# Patient Record
Sex: Female | Born: 1970 | Race: White | Hispanic: No | Marital: Married | Smoking: Current every day smoker
Health system: Southern US, Community
[De-identification: ages and names within clinical notes are randomized; demographics above are authoritative.]

## PROBLEM LIST (undated history)

## (undated) DIAGNOSIS — Z720 Tobacco use: Secondary | ICD-10-CM

## (undated) DIAGNOSIS — I2510 Atherosclerotic heart disease of native coronary artery without angina pectoris: Secondary | ICD-10-CM

## (undated) DIAGNOSIS — Z9289 Personal history of other medical treatment: Secondary | ICD-10-CM

## (undated) DIAGNOSIS — E785 Hyperlipidemia, unspecified: Secondary | ICD-10-CM

## (undated) HISTORY — DX: Hyperlipidemia, unspecified: E78.5

## (undated) HISTORY — DX: Personal history of other medical treatment: Z92.89

## (undated) HISTORY — DX: Atherosclerotic heart disease of native coronary artery without angina pectoris: I25.10

---

## 2000-04-05 ENCOUNTER — Other Ambulatory Visit: Admission: RE | Admit: 2000-04-05 | Discharge: 2000-04-05 | Payer: Self-pay | Admitting: Obstetrics and Gynecology

## 2000-10-28 ENCOUNTER — Inpatient Hospital Stay (HOSPITAL_COMMUNITY): Admission: AD | Admit: 2000-10-28 | Discharge: 2000-10-30 | Payer: Self-pay | Admitting: Obstetrics and Gynecology

## 2000-12-04 ENCOUNTER — Other Ambulatory Visit: Admission: RE | Admit: 2000-12-04 | Discharge: 2000-12-04 | Payer: Self-pay | Admitting: Obstetrics and Gynecology

## 2006-01-24 ENCOUNTER — Observation Stay: Payer: Self-pay | Admitting: Obstetrics and Gynecology

## 2007-09-06 IMAGING — US US OB < 14 WEEKS - US OB TV
1 series · 17 of 28 positions shown · non-contrast
Comparison: none

REASON FOR EXAM: vaginal bleeding
COMMENTS:

[Series 1: us ob < 14 weeks - us ob tv · 17 of 67 slices shown]
[im 1/67]
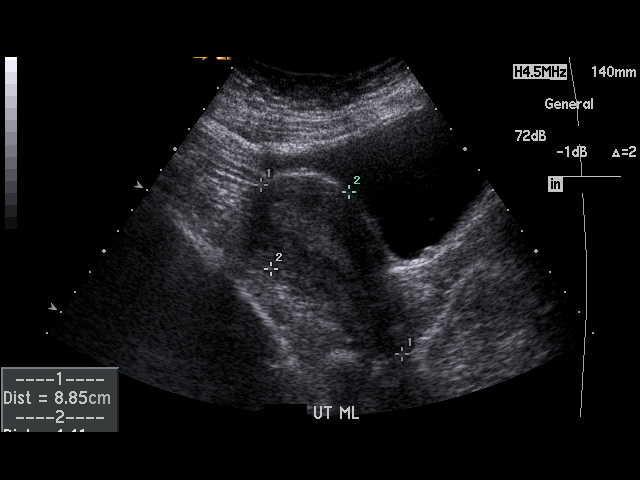
[im 5/67]
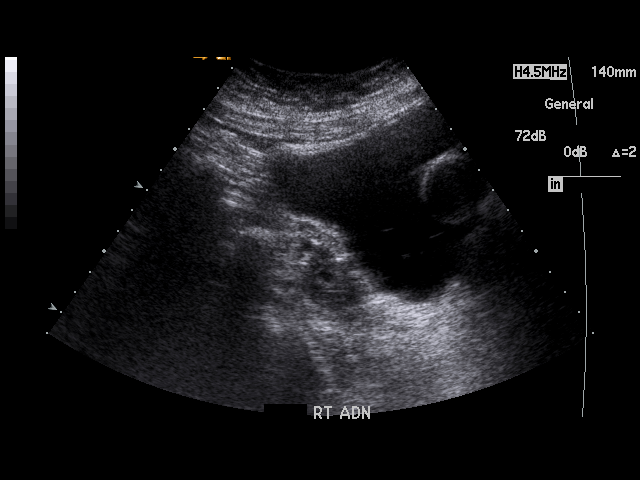
[im 10/67]
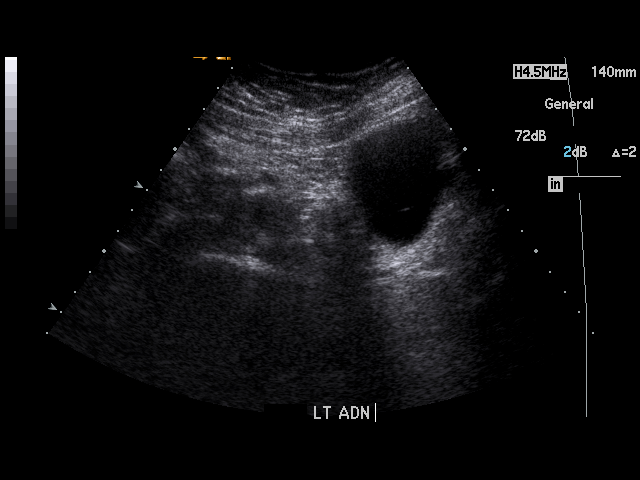
[im 13/67]
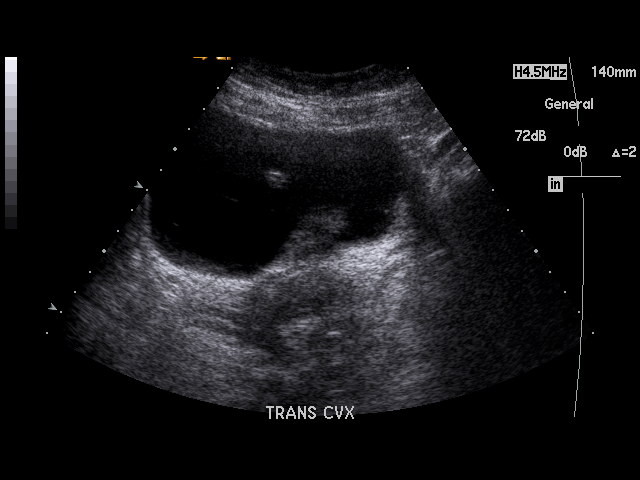
[im 18/67]
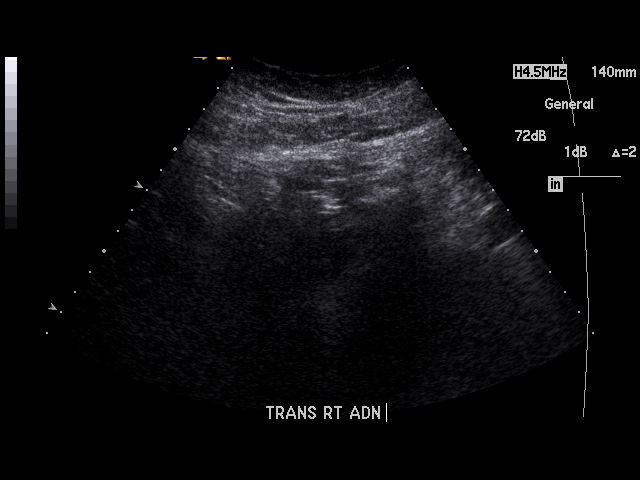
[im 23/67]
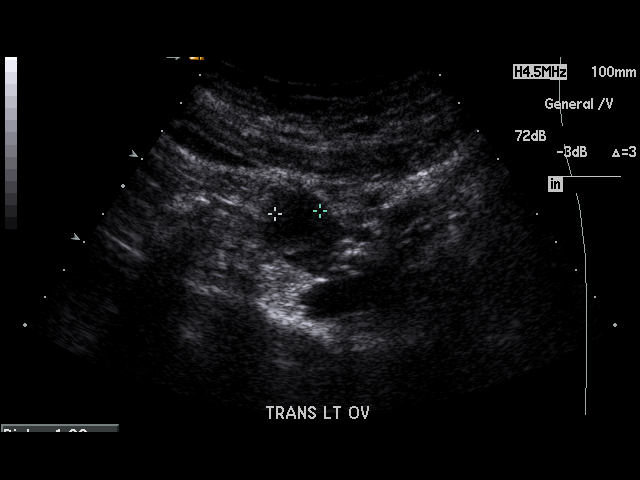
[im 25/67]
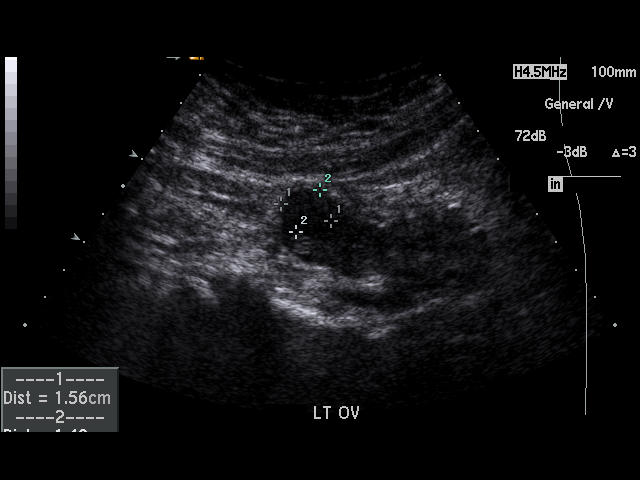
[im 30/67]
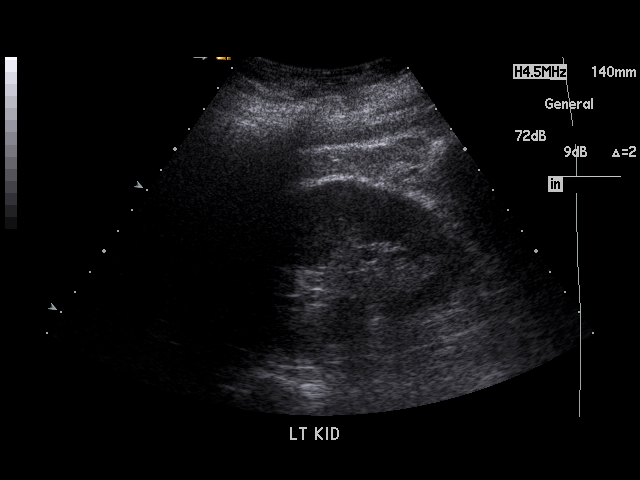
[im 35/67]
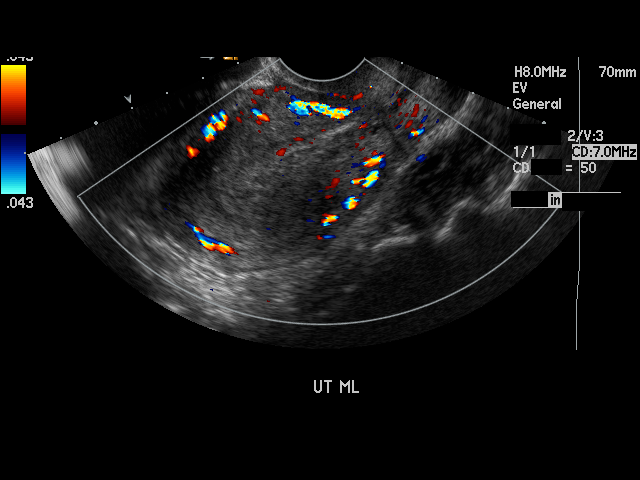
[im 37/67]
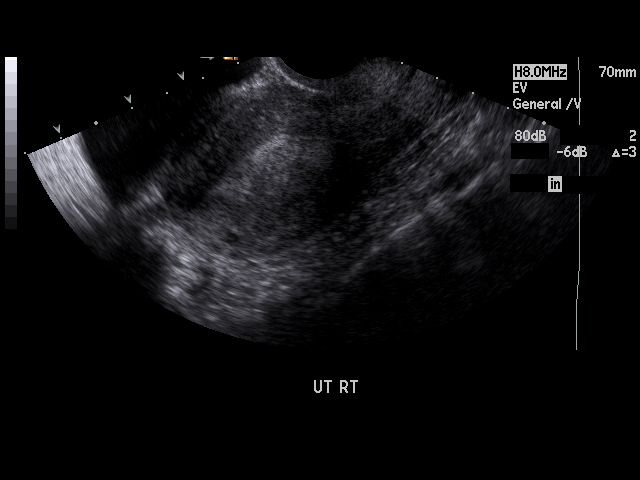
[im 42/67]
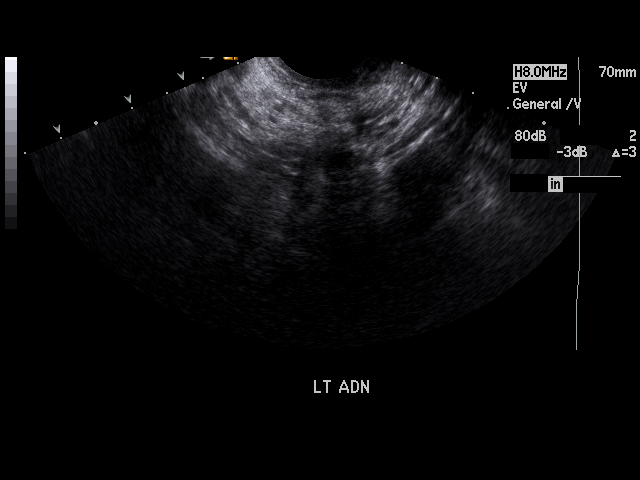
[im 45/67]
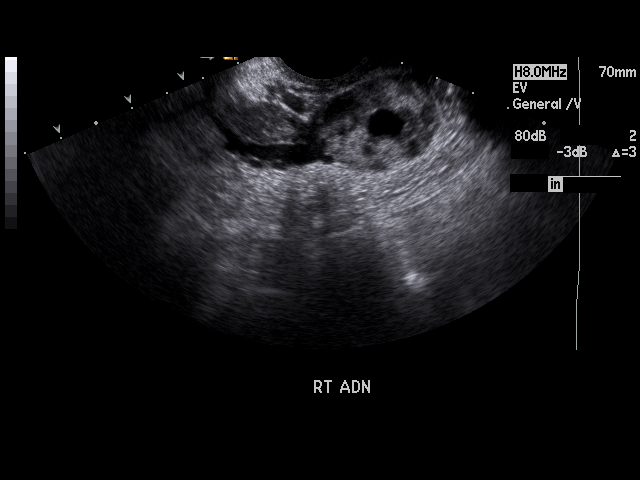
[im 49/67]
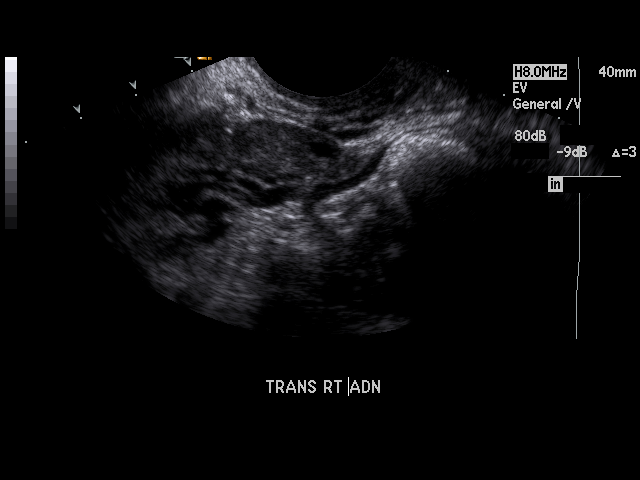
[im 54/67]
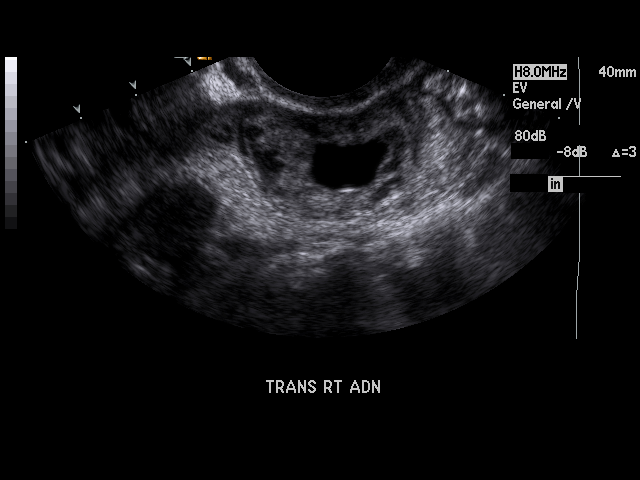
[im 57/67]
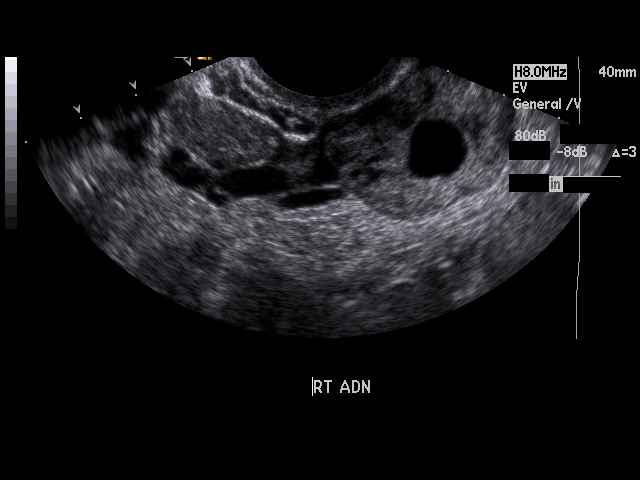
[im 62/67]
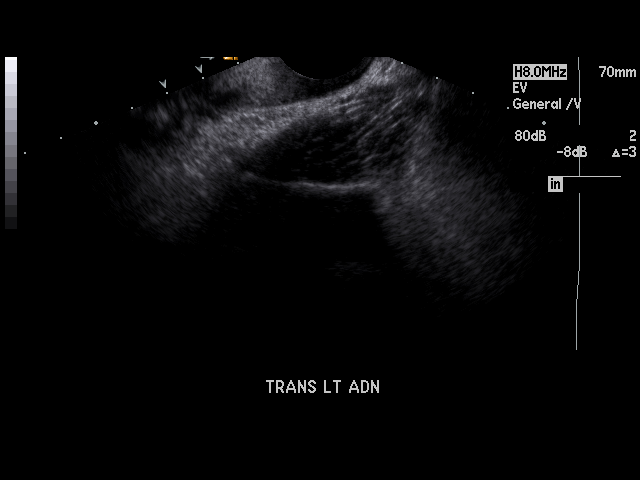
[im 67/67]
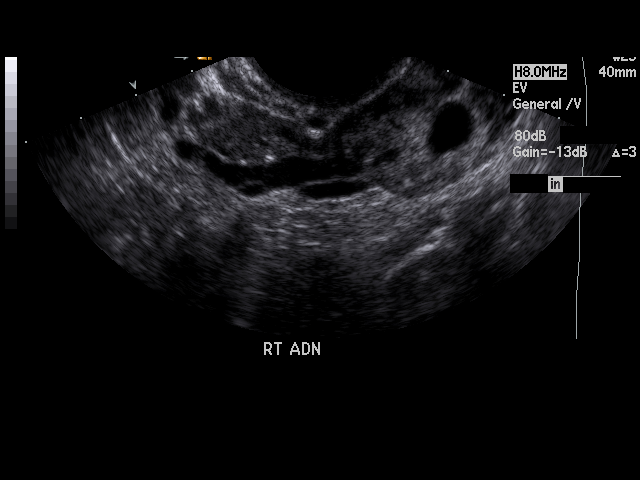

[17 of 28 positions shown; findings below may reference images not displayed]

PROCEDURE:     US  - US OB LESS THAN 14 WEEKS  - January 24, 2006  [DATE]

RESULT:          The uterus measures 8.85 x 4.41 cm.  Endometrial thickness
is 1.26 cm.  There does not appear to be ultrasound evidence of an
intrauterine gestational sac.  Evaluation of the RIGHT adnexal region
demonstrates a thick-walled cystic mass within the RIGHT adnexa measuring
3.49 x 2.97 x 2.13 cm.  This appears to be adjacent to the RIGHT ovary and
demonstrates peripheral flow.  The RIGHT ovary measures 2.26 x 1.61 x
cm and the LEFT 3.19 x 2.01 x 1.83 cm.  The LEFT ovary demonstrates a
dominant cyst versus a follicle measuring 1.56 x 1.33 x 1.43 cm.  There
appears to be prominent vessels within the RIGHT adnexal region.  No
evidence of drainable loculated fluid collections nor significant free fluid
is demonstrated within the pelvis.  The urinary bladder is distended with
urine.
IMPRESSION: Findings worrisome for a RIGHT adnexal ectopic
pregnancy.  Correlation with the patient's beta hCG is recommended.  If the
beta hCG does not correlate with an ectopic pregnancy, then other etiologies
such as a RIGHT adnexal mass is of differential consideration.  OB/GYN
consultation is recommended.

Dr. Lan, of the emergency department, was informed of these findings at
the time of the initial interpretation.

## 2014-03-25 ENCOUNTER — Ambulatory Visit: Payer: Self-pay | Admitting: Physician Assistant

## 2015-03-03 ENCOUNTER — Ambulatory Visit (INDEPENDENT_AMBULATORY_CARE_PROVIDER_SITE_OTHER): Payer: BLUE CROSS/BLUE SHIELD

## 2015-03-03 ENCOUNTER — Encounter: Payer: Self-pay | Admitting: Podiatry

## 2015-03-03 ENCOUNTER — Ambulatory Visit (INDEPENDENT_AMBULATORY_CARE_PROVIDER_SITE_OTHER): Payer: BLUE CROSS/BLUE SHIELD | Admitting: Podiatry

## 2015-03-03 VITALS — BP 162/99 | HR 78

## 2015-03-03 DIAGNOSIS — M722 Plantar fascial fibromatosis: Secondary | ICD-10-CM

## 2015-03-03 MED ORDER — MELOXICAM 7.5 MG PO TABS: 7.5000 mg | ORAL_TABLET | Freq: Every day | ORAL | Status: DC

## 2015-03-03 NOTE — Progress Notes (Signed)
   Subjective:    Patient ID: Marisa Malone, female    DOB: July 30, 1970, 44 y.o.   MRN: [id number]  HPI   44 year old female presents the office as concerns the left heel pain which is been ongoing for approximately 2 years. She states that she has pain in the morning  Which is better with ambulation. She does have pain and she stands on her feet all day. She has purchased some pads which did not help and she is also been taping her foot which helped some. She states the pain is intermittent and 9 daily basis. She denies any history of injury or trauma. No swelling or redness. No tingling or numbness. The pain does not wake are benign. No other complaints at this time.    Review of Systems  All other systems reviewed and are negative.      Objective:   Physical Exam AAO x3, NAD DP/PT pulses palpable bilaterally, CRT less than 3 seconds Protective sensation intact with Simms Weinstein monofilament, vibratory sensation intact, Achilles tendon reflex intact Tenderness to palpation overlying the plantar medial tubercle of the calcaneus to the left heel at the insertion of the plantar fascia. There is no pain along the course of plantar fascia within the arch of the foot. There is no pain with lateral compression of the calcaneus or pain the vibratory sensation. No pain on the posterior aspect of the calcaneus or along the course/insertion of the Achilles tendon. There is no overlying edema, erythema, increase in warmth. No other areas of tenderness palpation or pain with vibratory sensation to the foot/ankle. MMT 5/5, ROM WNL No open lesions or pre-ulcerative lesions are identified. No pain with calf compression, swelling, warmth, erythema.     Assessment & Plan:   44 year old female with left heel pain, likely plantar fasciitis -Treatment options discussed including all alternatives, risks, and complications -X-rays were obtained and reviewed with the patient.  -Etiology of symptoms were  discussed -Patient elects to proceed with steroid injection into the left heel. Under sterile skin preparation, a total of 2.5cc of kenalog 10, 0.5% Marcaine plain, and 2% lidocaine plain were infiltrated into the symptomatic area without complication. A band-aid was applied. Patient tolerated the injection well without complication. Post-injection care with discussed with the patient. Discussed with the patient to ice the area over the next couple of days to help prevent a steroid flare.  -Prescribed mobic. Discussed side effects of the medication and directed to stop if any are to occur and call the office. -Ice and stretching exercises daily -Plantar fasical brace dispensed  -Discussed shoegear modifications and orthotics.  -Follow-up in 3 weeks or sooner if any problems arise. In the meantime, encouraged to call the office with any questions, concerns, change in symptoms.   Ovid Curd, DPM

## 2015-03-03 NOTE — Patient Instructions (Signed)
Plantar Fasciitis (Heel Spur Syndrome) with Rehab The plantar fascia is a fibrous, ligament-like, soft-tissue structure that spans the bottom of the foot. Plantar fasciitis is a condition that causes pain in the foot due to inflammation of the tissue. SYMPTOMS   Pain and tenderness on the underneath side of the foot.  Pain that worsens with standing or walking. CAUSES  Plantar fasciitis is caused by irritation and injury to the plantar fascia on the underneath side of the foot. Common mechanisms of injury include:  Direct trauma to bottom of the foot.  Damage to a small nerve that runs under the foot where the main fascia attaches to the heel bone.  Stress placed on the plantar fascia due to bone spurs. RISK INCREASES WITH:   Activities that place stress on the plantar fascia (running, jumping, pivoting, or cutting).  Poor strength and flexibility.  Improperly fitted shoes.  Tight calf muscles.  Flat feet.  Failure to warm-up properly before activity.  Obesity. PREVENTION  Warm up and stretch properly before activity.  Allow for adequate recovery between workouts.  Maintain physical fitness:  Strength, flexibility, and endurance.  Cardiovascular fitness.  Maintain a health body weight.  Avoid stress on the plantar fascia.  Wear properly fitted shoes, including arch supports for individuals who have flat feet. PROGNOSIS  If treated properly, then the symptoms of plantar fasciitis usually resolve without surgery. However, occasionally surgery is necessary. RELATED COMPLICATIONS   Recurrent symptoms that may result in a chronic condition.  Problems of the lower back that are caused by compensating for the injury, such as limping.  Pain or weakness of the foot during push-off following surgery.  Chronic inflammation, scarring, and partial or complete fascia tear, occurring more often from repeated injections. TREATMENT  Treatment initially involves the use of  ice and medication to help reduce pain and inflammation. The use of strengthening and stretching exercises may help reduce pain with activity, especially stretches of the Achilles tendon. These exercises may be performed at home or with a therapist. Your caregiver may recommend that you use heel cups of arch supports to help reduce stress on the plantar fascia. Occasionally, corticosteroid injections are given to reduce inflammation. If symptoms persist for greater than 6 months despite non-surgical (conservative), then surgery may be recommended.  MEDICATION   If pain medication is necessary, then nonsteroidal anti-inflammatory medications, such as aspirin and ibuprofen, or other minor pain relievers, such as acetaminophen, are often recommended.  Do not take pain medication within 7 days before surgery.  Prescription pain relievers may be given if deemed necessary by your caregiver. Use only as directed and only as much as you need.  Corticosteroid injections may be given by your caregiver. These injections should be reserved for the most serious cases, because they may only be given a certain number of times. HEAT AND COLD  Cold treatment (icing) relieves pain and reduces inflammation. Cold treatment should be applied for 10 to 15 minutes every 2 to 3 hours for inflammation and pain and immediately after any activity that aggravates your symptoms. Use ice packs or massage the area with a piece of ice (ice massage).  Heat treatment may be used prior to performing the stretching and strengthening activities prescribed by your caregiver, physical therapist, or athletic trainer. Use a heat pack or soak the injury in warm water. SEEK IMMEDIATE MEDICAL CARE IF:  Treatment seems to offer no benefit, or the condition worsens.  Any medications produce adverse side effects. EXERCISES RANGE   OF MOTION (ROM) AND STRETCHING EXERCISES - Plantar Fasciitis (Heel Spur Syndrome) These exercises may help you  when beginning to rehabilitate your injury. Your symptoms may resolve with or without further involvement from your physician, physical therapist or athletic trainer. While completing these exercises, remember:   Restoring tissue flexibility helps normal motion to return to the joints. This allows healthier, less painful movement and activity.  An effective stretch should be held for at least 30 seconds.  A stretch should never be painful. You should only feel a gentle lengthening or release in the stretched tissue. RANGE OF MOTION - Toe Extension, Flexion  Sit with your right / left leg crossed over your opposite knee.  Grasp your toes and gently pull them back toward the top of your foot. You should feel a stretch on the bottom of your toes and/or foot.  Hold this stretch for __________ seconds.  Now, gently pull your toes toward the bottom of your foot. You should feel a stretch on the top of your toes and or foot.  Hold this stretch for __________ seconds. Repeat __________ times. Complete this stretch __________ times per day.  RANGE OF MOTION - Ankle Dorsiflexion, Active Assisted  Remove shoes and sit on a chair that is preferably not on a carpeted surface.  Place right / left foot under knee. Extend your opposite leg for support.  Keeping your heel down, slide your right / left foot back toward the chair until you feel a stretch at your ankle or calf. If you do not feel a stretch, slide your bottom forward to the edge of the chair, while still keeping your heel down.  Hold this stretch for __________ seconds. Repeat __________ times. Complete this stretch __________ times per day.  STRETCH - Gastroc, Standing  Place hands on wall.  Extend right / left leg, keeping the front knee somewhat bent.  Slightly point your toes inward on your back foot.  Keeping your right / left heel on the floor and your knee straight, shift your weight toward the wall, not allowing your back to  arch.  You should feel a gentle stretch in the right / left calf. Hold this position for __________ seconds. Repeat __________ times. Complete this stretch __________ times per day. STRETCH - Soleus, Standing  Place hands on wall.  Extend right / left leg, keeping the other knee somewhat bent.  Slightly point your toes inward on your back foot.  Keep your right / left heel on the floor, bend your back knee, and slightly shift your weight over the back leg so that you feel a gentle stretch deep in your back calf.  Hold this position for __________ seconds. Repeat __________ times. Complete this stretch __________ times per day. STRETCH - Gastrocsoleus, Standing  Note: This exercise can place a lot of stress on your foot and ankle. Please complete this exercise only if specifically instructed by your caregiver.   Place the ball of your right / left foot on a step, keeping your other foot firmly on the same step.  Hold on to the wall or a rail for balance.  Slowly lift your other foot, allowing your body weight to press your heel down over the edge of the step.  You should feel a stretch in your right / left calf.  Hold this position for __________ seconds.  Repeat this exercise with a slight bend in your right / left knee. Repeat __________ times. Complete this stretch __________ times per day.    STRENGTHENING EXERCISES - Plantar Fasciitis (Heel Spur Syndrome)  These exercises may help you when beginning to rehabilitate your injury. They may resolve your symptoms with or without further involvement from your physician, physical therapist or athletic trainer. While completing these exercises, remember:   Muscles can gain both the endurance and the strength needed for everyday activities through controlled exercises.  Complete these exercises as instructed by your physician, physical therapist or athletic trainer. Progress the resistance and repetitions only as guided. STRENGTH -  Towel Curls  Sit in a chair positioned on a non-carpeted surface.  Place your foot on a towel, keeping your heel on the floor.  Pull the towel toward your heel by only curling your toes. Keep your heel on the floor.  If instructed by your physician, physical therapist or athletic trainer, add ____________________ at the end of the towel. Repeat __________ times. Complete this exercise __________ times per day. STRENGTH - Ankle Inversion  Secure one end of a rubber exercise band/tubing to a fixed object (table, pole). Loop the other end around your foot just before your toes.  Place your fists between your knees. This will focus your strengthening at your ankle.  Slowly, pull your big toe up and in, making sure the band/tubing is positioned to resist the entire motion.  Hold this position for __________ seconds.  Have your muscles resist the band/tubing as it slowly pulls your foot back to the starting position. Repeat __________ times. Complete this exercises __________ times per day.  Document Released: 05/16/2005 Document Revised: 08/08/2011 Document Reviewed: 08/28/2008 [employer] Patient Information 2015 [employer]. This information is not intended to replace advice given to you by your health care provider. Make sure you discuss any questions you have with your health care provider.  

## 2015-03-24 ENCOUNTER — Encounter: Payer: Self-pay | Admitting: Podiatry

## 2015-03-24 ENCOUNTER — Ambulatory Visit (INDEPENDENT_AMBULATORY_CARE_PROVIDER_SITE_OTHER): Payer: BLUE CROSS/BLUE SHIELD | Admitting: Podiatry

## 2015-03-24 VITALS — BP 147/94 | HR 74 | Resp 18

## 2015-03-24 DIAGNOSIS — M722 Plantar fascial fibromatosis: Secondary | ICD-10-CM

## 2015-03-24 MED ORDER — TRIAMCINOLONE ACETONIDE 10 MG/ML IJ SUSP
10.0000 mg | Freq: Once | INTRAMUSCULAR | Status: AC
  Administered 2015-03-24: 10 mg

## 2015-03-24 NOTE — Progress Notes (Signed)
Patient ID: Marisa Malone, female   DOB: January 02, 1971, 44 y.o.   MRN: [id number]  Subjective: She presents the office today for follow-up evaluation of left heel pain. She states that overall her pain is improving on she does continue to get some intermittent discomfort. The patient is not a daily basis. She has continued with plantar fascial brace. She's been taking anti-inflammatories. She's been stretching icing. She denies any recent injury or trauma. No swelling or redness the left foot although she gets occasional splint the right ankle although does not hurt and she has noticed any redness or warmth. She was of this is because she is an walking differently at the left heel pain. No other complaints at this time in no acute changes.  Objective: AAO 3, NAD DP/PT pulses 2/4, CRT less than 3 seconds Protective sensation intact with Simms Weinstein monofilament There is mild, but improved, tenderness palpation on the plantar medial tubercle of the calcaneus at the insertion the plantar fascia on the left foot. There is no pain on the course of plantar fascial in the arch of the foot. No pain along the course/insertion of the Achilles tendon. Equinus is present. No pain with lateral compression of the calcaneus. There is no overlying edema, erythema, increase in warmth. There is very mild swelling to the ankle how there is no areas of tenderness to palpation. Is no pain on the ankle ligaments, pinpoint any tenderness. There is no pain or crepitation with ankle joint range of motion. No other areas of tenderness to bilateral lower extremities. No open lesions or pre-ulcerative lesions. No pain with calf compression, swelling, warmth, erythema.  Assessment: 44 year old female with left heel pain, likely plantar fasciitis which is resolving and right ankle swelling without any pain likely due to compensation  Plan: -Treatment options discussed including all alternatives, risks, and  complications -Patient elects to proceed with steroid injection into the left heel. Under sterile skin preparation, a total of 2.5cc of kenalog 10, 0.5% Marcaine plain, and 2% lidocaine plain were infiltrated into the symptomatic area without complication. A band-aid was applied. Patient tolerated the injection well without complication. Post-injection care with discussed with the patient. Discussed with the patient to ice the area over the next couple of days to help prevent a steroid flare.  -Plantar fascial taping was applied. She can remove this and with a plantar fascial brace as directed. -Continue stretching icing activities daily. -Continue supportive shoe gear. I discussed orthotics. -Dispensed night splint.  -Will continue monitor swelling to the right ankle. It's not resolved or further evaluate how there is no tenderness to this area. We will hold off on x-ray this time. -Follow-up in 4 weeks or sooner if any problems arise. In the meantime, encouraged to call the office with any questions, concerns, change in symptoms.   Ovid CurdMatthew Wagoner, DPM

## 2015-04-21 ENCOUNTER — Ambulatory Visit: Payer: BLUE CROSS/BLUE SHIELD | Admitting: Podiatry

## 2015-07-28 ENCOUNTER — Other Ambulatory Visit: Payer: Self-pay | Admitting: Physician Assistant

## 2015-07-28 DIAGNOSIS — Z1231 Encounter for screening mammogram for malignant neoplasm of breast: Secondary | ICD-10-CM

## 2015-08-04 ENCOUNTER — Ambulatory Visit
Admission: RE | Admit: 2015-08-04 | Discharge: 2015-08-04 | Disposition: A | Discharge status: Home / Self Care | Payer: BLUE CROSS/BLUE SHIELD | Source: Ambulatory Visit | Attending: Physician Assistant | Admitting: Physician Assistant

## 2015-08-04 DIAGNOSIS — Z1231 Encounter for screening mammogram for malignant neoplasm of breast: Secondary | ICD-10-CM | POA: Diagnosis present

## 2015-08-07 ENCOUNTER — Ambulatory Visit: Payer: Self-pay

## 2015-10-14 ENCOUNTER — Encounter: Payer: Self-pay | Admitting: Podiatry

## 2015-10-14 ENCOUNTER — Ambulatory Visit (INDEPENDENT_AMBULATORY_CARE_PROVIDER_SITE_OTHER): Payer: BLUE CROSS/BLUE SHIELD | Admitting: Podiatry

## 2015-10-14 VITALS — BP 124/78 | HR 80 | Resp 16

## 2015-10-14 DIAGNOSIS — L603 Nail dystrophy: Secondary | ICD-10-CM | POA: Diagnosis not present

## 2015-10-14 DIAGNOSIS — E669 Obesity, unspecified: Secondary | ICD-10-CM | POA: Insufficient documentation

## 2015-10-14 DIAGNOSIS — Z720 Tobacco use: Secondary | ICD-10-CM | POA: Insufficient documentation

## 2015-10-14 DIAGNOSIS — F17200 Nicotine dependence, unspecified, uncomplicated: Secondary | ICD-10-CM | POA: Insufficient documentation

## 2015-10-14 NOTE — Progress Notes (Signed)
She presents today with a chief complaint of a painful hallux nail left foot. She states that she has injured the foot several years ago breaking the toe and eventually the nail fell off regrowing which grew back clean and clear but then eventually erupted with discoloration and pain once again.  Objective: Vital signs are stable alert and oriented 3. Hallux nail plate left does demonstrate distal onycholysis and subungual debris malodor discoloration. It is tender to the touch there is mild tinea pedis surrounding the nail bed. Pulses are strongly palpable.  Assessment: Probable onychomycosis but nail dystrophy.  Plan: Samples of the skin and nail were sent for pathologic evaluation will notify her with the results for follow-up with her in 4 weeks.

## 2015-11-18 ENCOUNTER — Encounter: Payer: Self-pay | Admitting: Podiatry

## 2015-11-18 ENCOUNTER — Ambulatory Visit (INDEPENDENT_AMBULATORY_CARE_PROVIDER_SITE_OTHER): Payer: BLUE CROSS/BLUE SHIELD | Admitting: Podiatry

## 2015-11-18 DIAGNOSIS — L603 Nail dystrophy: Secondary | ICD-10-CM | POA: Diagnosis not present

## 2015-11-18 DIAGNOSIS — Z79899 Other long term (current) drug therapy: Secondary | ICD-10-CM | POA: Diagnosis not present

## 2015-11-18 MED ORDER — TERBINAFINE HCL 250 MG PO TABS: 250.0000 mg | ORAL_TABLET | Freq: Every day | ORAL | Status: DC

## 2015-11-18 NOTE — Progress Notes (Signed)
She presents today for follow-up of her nail pathology. She denies any changes in her toenails.  Objective: Vital signs are stable she is alert and oriented 3. Pathology report does demonstrate onychomycosis.  Assessment: Onychomycosis.  Plan: I recommended an oral antifungal. Terbinafine was prescribed. 30 tablets one by mouth daily. She will also have liver profile performed. Should this come back abnormal we will are immediately. I will follow-up with her in 1 month for another prescription and another liver profile. We did discuss the pros and cons of this medication today and the risks that are involved with this medication and any other medicines. I will follow-up with her as needed or in 1 month.

## 2015-11-20 LAB — CBC WITH DIFFERENTIAL/PLATELET
BASOS ABS: 0 10*3/uL (ref 0.0–0.2)
Basos: 0 %
EOS (ABSOLUTE): 0.1 10*3/uL (ref 0.0–0.4)
EOS: 1 %
HEMOGLOBIN: 14.6 g/dL (ref 11.1–15.9)
Hematocrit: 44.5 % (ref 34.0–46.6)
IMMATURE GRANS (ABS): 0 10*3/uL (ref 0.0–0.1)
IMMATURE GRANULOCYTES: 0 %
LYMPHS: 34 %
Lymphocytes Absolute: 1.9 10*3/uL (ref 0.7–3.1)
MCH: 30.4 pg (ref 26.6–33.0)
MCHC: 32.8 g/dL (ref 31.5–35.7)
MCV: 93 fL (ref 79–97)
MONOCYTES: 11 %
Monocytes Absolute: 0.6 10*3/uL (ref 0.1–0.9)
Neutrophils Absolute: 3 10*3/uL (ref 1.4–7.0)
Neutrophils: 54 %
Platelets: 274 10*3/uL (ref 150–379)
RBC: 4.8 x10E6/uL (ref 3.77–5.28)
RDW: 13.5 % (ref 12.3–15.4)
WBC: 5.6 10*3/uL (ref 3.4–10.8)

## 2015-11-20 LAB — HEPATIC FUNCTION PANEL
ALT: 21 IU/L (ref 0–32)
AST: 20 IU/L (ref 0–40)
Albumin: 4.2 g/dL (ref 3.5–5.5)
Alkaline Phosphatase: 48 IU/L (ref 39–117)
Bilirubin Total: 0.7 mg/dL (ref 0.0–1.2)
Bilirubin, Direct: 0.15 mg/dL (ref 0.00–0.40)
TOTAL PROTEIN: 6.8 g/dL (ref 6.0–8.5)

## 2015-11-23 ENCOUNTER — Telehealth: Payer: Self-pay | Admitting: *Deleted

## 2015-11-23 NOTE — Telephone Encounter (Addendum)
-----   Message from Elinor ParkinsonMax T Hyatt, [place] sent at 11/23/2015  7:10 AM EDT ----- Blood work looiks good and may conitnue medication. Informed pt of Dr. Geryl RankinsHyatt's orders.

## 2015-12-21 ENCOUNTER — Encounter: Payer: Self-pay | Admitting: Podiatry

## 2015-12-21 ENCOUNTER — Ambulatory Visit (INDEPENDENT_AMBULATORY_CARE_PROVIDER_SITE_OTHER): Payer: BLUE CROSS/BLUE SHIELD | Admitting: Podiatry

## 2015-12-21 DIAGNOSIS — Z79899 Other long term (current) drug therapy: Secondary | ICD-10-CM

## 2015-12-21 DIAGNOSIS — B351 Tinea unguium: Secondary | ICD-10-CM

## 2015-12-21 MED ORDER — TERBINAFINE HCL 250 MG PO TABS: 250.0000 mg | ORAL_TABLET | Freq: Every day | ORAL | 0 refills | Status: DC

## 2015-12-21 NOTE — Progress Notes (Signed)
She presents today after having been on the Lamisil therapy for 1 month. She denies fever chills nausea vomiting muscle aches muscle pains itching or rashes. She has taken all the medication.  Objective: Vital signs are stable alert and oriented 3 pulses are palpable. Neurologic sensorium is intact. Nail plate is unchanged yet.  Assessment: Long-term therapy with onychomycosis and Lamisil.  Plan: Requested another liver profile and CBC also prescribed terbinafine 250 mg tablets 1 by mouth daily. 90 tablets were dispensed should her blood work on back abnormal I will notify her immediately. I will follow up with her in 4 months.

## 2015-12-23 ENCOUNTER — Ambulatory Visit: Payer: Self-pay | Admitting: Podiatry

## 2015-12-23 LAB — HEPATIC FUNCTION PANEL
ALBUMIN: 4.2 g/dL (ref 3.5–5.5)
ALK PHOS: 54 IU/L (ref 39–117)
ALT: 22 IU/L (ref 0–32)
AST: 15 IU/L (ref 0–40)
Bilirubin Total: 0.2 mg/dL (ref 0.0–1.2)
Bilirubin, Direct: 0.09 mg/dL (ref 0.00–0.40)
TOTAL PROTEIN: 6.6 g/dL (ref 6.0–8.5)

## 2015-12-25 ENCOUNTER — Telehealth: Payer: Self-pay | Admitting: *Deleted

## 2015-12-25 NOTE — Telephone Encounter (Addendum)
-----   Message from Elinor Parkinson, [place] sent at 12/23/2015  3:02 PM EDT ----- Blood work looks good and continue medication. 12/25/2015-Informed pt of Dr. Al Corpus orders.

## 2016-04-25 ENCOUNTER — Encounter: Payer: Self-pay | Admitting: Podiatry

## 2016-04-25 ENCOUNTER — Ambulatory Visit (INDEPENDENT_AMBULATORY_CARE_PROVIDER_SITE_OTHER): Payer: BLUE CROSS/BLUE SHIELD | Admitting: Podiatry

## 2016-04-25 DIAGNOSIS — L603 Nail dystrophy: Secondary | ICD-10-CM

## 2016-04-25 DIAGNOSIS — Z79899 Other long term (current) drug therapy: Secondary | ICD-10-CM | POA: Diagnosis not present

## 2016-04-25 MED ORDER — TERBINAFINE HCL 250 MG PO TABS: 250.0000 mg | ORAL_TABLET | Freq: Every day | ORAL | 0 refills | Status: DC

## 2016-04-26 NOTE — Progress Notes (Signed)
She presents today for a follow-up of her onychomycosis. She is now been taking the antifungal Lamisil for 4 months and she has been off of it for the past month. She denies any problems taking medication. Occasional upset stomach that she contributes to the medicine. She states that her toenails are clearing up.  Objective: Vital signs are stable she's alert and oriented 3. Pulses are palpable. No open lesions or wounds. Her nails appear to have grown out by approximately 50%.  Assessment: Onychomycosis long-term therapy of Lamisil.  Plan: At this point it is my recommendation that she continue to take the Lamisil 1 tablet every other day for the next 2 months I will follow-up with her in 3 months. This prescription was ordered today. He

## 2016-07-27 ENCOUNTER — Ambulatory Visit: Payer: BLUE CROSS/BLUE SHIELD | Admitting: Podiatry

## 2017-09-13 ENCOUNTER — Encounter: Payer: Self-pay | Admitting: Podiatry

## 2017-09-13 ENCOUNTER — Ambulatory Visit (INDEPENDENT_AMBULATORY_CARE_PROVIDER_SITE_OTHER): Payer: BLUE CROSS/BLUE SHIELD | Admitting: Podiatry

## 2017-09-13 DIAGNOSIS — Z79899 Other long term (current) drug therapy: Secondary | ICD-10-CM

## 2017-09-13 DIAGNOSIS — L603 Nail dystrophy: Secondary | ICD-10-CM | POA: Diagnosis not present

## 2017-09-13 DIAGNOSIS — L600 Ingrowing nail: Secondary | ICD-10-CM

## 2017-09-13 MED ORDER — TERBINAFINE HCL 250 MG PO TABS: 250.0000 mg | ORAL_TABLET | Freq: Every day | ORAL | 0 refills | Status: DC

## 2017-09-13 MED ORDER — NEOMYCIN-POLYMYXIN-HC 1 % OT SOLN: OTIC | 1 refills | Status: DC

## 2017-09-13 NOTE — Patient Instructions (Signed)

## 2017-09-13 NOTE — Progress Notes (Signed)
She presents today after having not seen her for a couple of years with a chief complaint of recurrence of onychomycosis hallux left.  She states that this will reoccurred first and then by big toenail and lysis right foot become very painful and sore and probably needs to be removed.  Objective: Vital signs are stable she is alert and oriented x3 pulses are palpable.  Neurologic sensorium is intact deep tendon reflexes are intact muscle strength is normal symmetrical bilateral.  Cutaneous evaluation demonstrates onychocryptosis of the with a white subungual onychomycosis right foot.  Left hallux does demonstrate a thickened discolored flattened dark nail plate which is nontender left foot.  There is no surrounding tinea pedis.  Assessment: Onychocryptosis with ingrown nail hallux right and probable onychomycosis.  Probable onychomycosis left foot.  Plan: We will start her back on her Lamisil after a liver profile requisition was provided to her today.  Also performed a total nail avulsion which was sent for pathologic evaluation.  Total nail avulsion was performed after 3 cc of a 50-50 mixture of Marcaine plain lidocaine plain was infiltrated in a hallux block.  Is prepped and draped as normal sterile fashion.  There was avulsed placed on the back he will be sent for pathologic pathology and the toe was dressed with Silvadene cream Telfa pad a dresser compressive dressing.  She was provided both oral and written home-going instructions for the care and soaking of the toe.  Follow-up with her in 2 weeks to reevaluate.

## 2017-09-22 LAB — HEPATIC FUNCTION PANEL
ALBUMIN: 4.3 g/dL (ref 3.5–5.5)
ALT: 17 IU/L (ref 0–32)
AST: 19 IU/L (ref 0–40)
Alkaline Phosphatase: 57 IU/L (ref 39–117)
BILIRUBIN TOTAL: 0.3 mg/dL (ref 0.0–1.2)
Bilirubin, Direct: 0.1 mg/dL (ref 0.00–0.40)
Total Protein: 6.9 g/dL (ref 6.0–8.5)

## 2017-09-26 ENCOUNTER — Telehealth: Payer: Self-pay | Admitting: *Deleted

## 2017-09-26 NOTE — Telephone Encounter (Signed)
-----   Message from Elinor Parkinson, [place] sent at 09/25/2017  7:10 AM EDT ----- Blood work looks perfect.

## 2017-09-26 NOTE — Telephone Encounter (Signed)
I informed pt of Dr. Hyatt's review of results and orders. 

## 2017-10-04 ENCOUNTER — Ambulatory Visit: Payer: Self-pay | Admitting: Podiatry

## 2017-10-25 ENCOUNTER — Encounter: Payer: Self-pay | Admitting: Podiatry

## 2017-10-25 ENCOUNTER — Ambulatory Visit (INDEPENDENT_AMBULATORY_CARE_PROVIDER_SITE_OTHER): Payer: BLUE CROSS/BLUE SHIELD | Admitting: Podiatry

## 2017-10-25 DIAGNOSIS — Z79899 Other long term (current) drug therapy: Secondary | ICD-10-CM

## 2017-10-25 MED ORDER — TERBINAFINE HCL 250 MG PO TABS: 250.0000 mg | ORAL_TABLET | Freq: Every day | ORAL | 0 refills | Status: DC

## 2017-10-25 NOTE — Progress Notes (Signed)
She states that she is doing great with the medication denies fever chills nausea vomiting muscle aches and pains rashes and itching.  States that she has just a few more Lamisil tablets to go.  Objective: No change in toenails as of yet.  Assessment: Long-term therapy with Lamisil for onychomycosis.  Plan: Start her on a 90-day regimen of Lamisil today 1 tablet daily.  Also requested another liver profile.  Should this come back abnormal I will notify her immediately.

## 2017-10-26 LAB — HEPATIC FUNCTION PANEL
ALBUMIN: 4.4 g/dL (ref 3.5–5.5)
ALT: 14 IU/L (ref 0–32)
AST: 14 IU/L (ref 0–40)
Alkaline Phosphatase: 52 IU/L (ref 39–117)
BILIRUBIN TOTAL: 0.5 mg/dL (ref 0.0–1.2)
Bilirubin, Direct: 0.15 mg/dL (ref 0.00–0.40)
Total Protein: 6.9 g/dL (ref 6.0–8.5)

## 2017-11-02 ENCOUNTER — Telehealth: Payer: Self-pay | Admitting: *Deleted

## 2017-11-02 NOTE — Telephone Encounter (Signed)
I informed pt of Dr. Hyatt's review of results and orders. 

## 2017-11-02 NOTE — Telephone Encounter (Signed)
-----   Message from Elinor ParkinsonMax T Hyatt, [place] sent at 10/29/2017  6:41 AM EDT ----- Blood work looks perfect may continue medications.

## 2018-02-28 ENCOUNTER — Ambulatory Visit (INDEPENDENT_AMBULATORY_CARE_PROVIDER_SITE_OTHER): Payer: BLUE CROSS/BLUE SHIELD | Admitting: Podiatry

## 2018-02-28 ENCOUNTER — Encounter: Payer: Self-pay | Admitting: Podiatry

## 2018-02-28 DIAGNOSIS — M79676 Pain in unspecified toe(s): Secondary | ICD-10-CM

## 2018-02-28 DIAGNOSIS — L603 Nail dystrophy: Secondary | ICD-10-CM

## 2018-02-28 MED ORDER — TERBINAFINE HCL 250 MG PO TABS: 250.0000 mg | ORAL_TABLET | Freq: Every day | ORAL | 0 refills | Status: DC

## 2018-02-28 NOTE — Progress Notes (Signed)
She presents today for follow-up of her Lamisil therapy and states that she is doing just great.  She states that everything seems to be growing out without any problem states that she drew out the last few days of the medication because of the delayed appointment.  Denies fever chills nausea vomiting muscle aches and pains.  Objective: Toenails appear to be at least about 75% grown out.  There are elongated pathologically mycotic but resolving.  Assessment resolving onychomycosis.  Plan: I debrided her nails 1 through 5 for her today I also recommended that she do every other day dosing with the Lamisil and I will follow-up with her in 3 months.

## 2018-02-28 NOTE — Patient Instructions (Signed)
Dr. Hyatt has sent over a refill for Lamisil to your pharmacy today. The instructions on your bottle will say "take 1 tablet daily", however, he would like for you to take one pill every other day. He will follow up with you in 3 months to re-evaluate your toenails. 

## 2018-05-09 ENCOUNTER — Ambulatory Visit (INDEPENDENT_AMBULATORY_CARE_PROVIDER_SITE_OTHER): Payer: BLUE CROSS/BLUE SHIELD

## 2018-05-09 ENCOUNTER — Ambulatory Visit (INDEPENDENT_AMBULATORY_CARE_PROVIDER_SITE_OTHER): Payer: BLUE CROSS/BLUE SHIELD | Admitting: Podiatry

## 2018-05-09 ENCOUNTER — Encounter: Payer: Self-pay | Admitting: Podiatry

## 2018-05-09 DIAGNOSIS — M722 Plantar fascial fibromatosis: Secondary | ICD-10-CM | POA: Diagnosis not present

## 2018-05-09 DIAGNOSIS — L603 Nail dystrophy: Secondary | ICD-10-CM | POA: Diagnosis not present

## 2018-05-09 MED ORDER — METHYLPREDNISOLONE 4 MG PO TABS: ORAL_TABLET | ORAL | 0 refills | Status: DC

## 2018-05-09 MED ORDER — MELOXICAM 15 MG PO TABS: 15.0000 mg | ORAL_TABLET | Freq: Every day | ORAL | 3 refills | Status: DC

## 2018-05-09 MED ORDER — TERBINAFINE HCL 250 MG PO TABS: 250.0000 mg | ORAL_TABLET | Freq: Every day | ORAL | 0 refills | Status: DC

## 2018-05-09 NOTE — Progress Notes (Signed)
Presents today chief complaint of painful hallux nails.  States that she still continues to take the Lamisil and seems to be helping.  She is taking it every other day at this point.  She also has right heel pain.  Objective: Vital signs are stable she is oriented x3.  Pulses are palpable.  Tenderness was sharply incurvated they appear to be thinning out and growing out very nicely that is slow because there is some nail dystrophy associated with this.  She also has pain on palpation medial calcaneal tubercle of the right heel.  Radiographs taken today confirm soft tissue increased density plantar skin insertion site of the right heel.  Assessment: Fasciitis right heel.  Onychomycosis hallux bilateral.  This is resolved by approximately 85 to 90% at this time.  Plan: I went ahead and injected the right heel today with 20 mg Kenalog 5 mg of Marcaine start a Medrol Dosepak to be followed by meloxicam placed in a plantar fascial brace.  I also started by her back on her Lamisil 250 mg tablets 1 p.o. every other day.

## 2018-08-08 ENCOUNTER — Ambulatory Visit (INDEPENDENT_AMBULATORY_CARE_PROVIDER_SITE_OTHER): Payer: BLUE CROSS/BLUE SHIELD | Admitting: Podiatry

## 2018-08-08 ENCOUNTER — Other Ambulatory Visit: Payer: Self-pay

## 2018-08-08 ENCOUNTER — Encounter: Payer: Self-pay | Admitting: Podiatry

## 2018-08-08 DIAGNOSIS — M722 Plantar fascial fibromatosis: Secondary | ICD-10-CM

## 2018-08-08 DIAGNOSIS — M79676 Pain in unspecified toe(s): Secondary | ICD-10-CM

## 2018-08-08 DIAGNOSIS — B351 Tinea unguium: Secondary | ICD-10-CM

## 2018-08-08 MED ORDER — TERBINAFINE HCL 250 MG PO TABS: 250.0000 mg | ORAL_TABLET | Freq: Every day | ORAL | 0 refills | Status: DC

## 2018-08-08 NOTE — Progress Notes (Signed)
She presents today for follow-up of her right heel pain.  Otherwise she is here for follow-up of the nail fungus which she is completed 120 days +2 cycles of every other day dosing.  She states they have appear to be growing out 100% that is need to be trimmed.  Objective: Vital signs are stable she is alert and oriented x3.  She has pain on palpation medial Cokato tubercle of the right heel.  Nail plates appear to be 100% grown out.  They are long and dystrophic.  Assessment: Nail dystrophy but onychomycosis appears to be resolved.  Plantar fasciitis recurrence.  Plan: Discussed etiology pathology conservative therapies extra Betadine skin prep I injected 20 mg Kenalog 5 mg Marcaine point maximal tenderness medial aspect of the right heel.  Tolerated seizure well without complications.  Debrided toenails 1 through 5 bilateral.  Follow-up with [place] on an as-needed basis.

## 2018-11-07 ENCOUNTER — Encounter: Payer: Self-pay | Admitting: Podiatry

## 2018-11-07 ENCOUNTER — Ambulatory Visit: Payer: BLUE CROSS/BLUE SHIELD | Admitting: Podiatry

## 2018-11-07 ENCOUNTER — Other Ambulatory Visit: Payer: Self-pay

## 2018-11-07 VITALS — Temp 98.8°F

## 2018-11-07 DIAGNOSIS — L603 Nail dystrophy: Secondary | ICD-10-CM | POA: Diagnosis not present

## 2018-11-07 DIAGNOSIS — M722 Plantar fascial fibromatosis: Secondary | ICD-10-CM | POA: Diagnosis not present

## 2018-11-07 MED ORDER — TERBINAFINE HCL 250 MG PO TABS: 250.0000 mg | ORAL_TABLET | Freq: Every day | ORAL | 0 refills | Status: DC

## 2018-11-07 NOTE — Progress Notes (Signed)
She presents today for follow-up of her nail fungus she states that she is finished her third round of every other day dosing.  She states that is looking so much better she is very happy with the outcome.  She states that the plantar fascia in the right heel was doing great but has started to bother me so now since I am having to work a lot more and I still have not purchased my new shoes yet.  So I do not think it will go away until I get new shoes.  Objective: Vital signs are stable alert and oriented x3.  Pulses are palpable.  There is no erythema edema cellulitis drainage or odor.  She has pain on palpation medial Cokato tubercle of the right heel.  She also has nearly 100% grown out of her onychomycosis.  Assessment: Onychomycosis is resolving with long-term use of Lamisil.  Plantar fasciitis was resolving but has reoccurred.  Plan: At this point she will continue every other day dosing with Lamisil.  But I also injected the medial aspect of her heel after sterile Betadine skin prep 20 mg Kenalog 5 mg Marcaine.  She tolerated seizure well without complications follow-up with her in 3 months

## 2019-01-30 ENCOUNTER — Other Ambulatory Visit: Payer: Self-pay

## 2019-01-30 ENCOUNTER — Ambulatory Visit: Payer: BC Managed Care – PPO | Admitting: Podiatry

## 2019-01-30 ENCOUNTER — Encounter: Payer: Self-pay | Admitting: Podiatry

## 2019-01-30 ENCOUNTER — Ambulatory Visit (INDEPENDENT_AMBULATORY_CARE_PROVIDER_SITE_OTHER): Payer: BC Managed Care – PPO | Admitting: Podiatry

## 2019-01-30 DIAGNOSIS — M722 Plantar fascial fibromatosis: Secondary | ICD-10-CM

## 2019-01-30 DIAGNOSIS — L603 Nail dystrophy: Secondary | ICD-10-CM

## 2019-01-30 MED ORDER — TERBINAFINE HCL 250 MG PO TABS: 250.0000 mg | ORAL_TABLET | Freq: Every day | ORAL | 0 refills | Status: DC

## 2019-01-30 NOTE — Progress Notes (Signed)
She presents today for follow-up of her Lamisil therapy.  And she is also requesting another injection for her right heel pain.  Objective: Vital signs are stable she is alert and oriented x3.  Toenails have grown out almost 100% at this point.  Those there is just a small amount still lingering to the distalmost edge.  She still has pain on palpation medial Cokato tubercle of the right heel.  Assessment: Chronic intractable plantar fasciitis right.  Well-healing onychomycosis with use of Lamisil.  Plan: Going go ahead and refill her Lamisil 30 tablets 1 every other day and go ahead and inject her right heel again with 20 mg Kenalog 5 mg Marcaine point maximal tenderness.  Like to follow-up with her in 3 to 4 months.

## 2019-05-01 ENCOUNTER — Ambulatory Visit (INDEPENDENT_AMBULATORY_CARE_PROVIDER_SITE_OTHER): Payer: BC Managed Care – PPO | Admitting: Podiatry

## 2019-05-01 ENCOUNTER — Other Ambulatory Visit: Payer: Self-pay

## 2019-05-01 DIAGNOSIS — Z79899 Other long term (current) drug therapy: Secondary | ICD-10-CM | POA: Diagnosis not present

## 2019-05-01 DIAGNOSIS — M722 Plantar fascial fibromatosis: Secondary | ICD-10-CM

## 2019-05-01 MED ORDER — TERBINAFINE HCL 250 MG PO TABS: 250.0000 mg | ORAL_TABLET | Freq: Every day | ORAL | 0 refills | Status: DC

## 2019-05-01 NOTE — Progress Notes (Signed)
She presents today stating that the Lamisil has really helped her toes.  She still has some tenderness in her right heel.  Objective: Vital signs are stable she alert oriented x3.  Pulses are palpable.  Onychomycosis appears to be resolving about 75% at this point.  She has been on palpation medial tangible of the right heel.  Assessment: Pain in limb secondary onychomycosis long-term therapy for this about 75% resolved.  Plantar fasciitis recurrence.  Plan: After Betadine skin prep I injected 20 mg Kenalog 5 mg Marcaine point of maximal tenderness of the right heel.  Tolerated procedure well without complications.  And I am going to continue her on Lamisil for another 60 days 1 tablet every other day.  Follow-up with me in 3 months

## 2019-07-24 ENCOUNTER — Encounter: Payer: Self-pay | Admitting: Podiatry

## 2019-07-24 ENCOUNTER — Ambulatory Visit: Payer: BC Managed Care – PPO | Admitting: Podiatry

## 2019-07-24 ENCOUNTER — Other Ambulatory Visit: Payer: Self-pay

## 2019-07-24 DIAGNOSIS — M722 Plantar fascial fibromatosis: Secondary | ICD-10-CM

## 2019-07-24 DIAGNOSIS — L603 Nail dystrophy: Secondary | ICD-10-CM

## 2019-07-24 NOTE — Progress Notes (Signed)
She presents today for follow-up of her plantar fasciitis right heel which she states is recently flared up and she states that she is almost finished with her Lamisil every other day treatment and her toenails are looking fabulous.  He is very happy with the outcome.  She denies fever chills nausea vomiting muscle aches pains itching or rashes are associated with medications.  She denies any further trauma to the right heel.  Objective: Vital signs are stable she is alert and oriented x3.  Toenail plates are 592% clear.  She has pain on palpation medial calcaneal tubercle of the right heel.  Pulses are palpable no open lesions or wounds are noted.  Assessment: Well-healed onychomycosis with long-term use of Lamisil.  Also plantar fasciitis right heel recurrent.  Plan: Discontinue the use of Lamisil when she has completed the last pill.  Watch carefully and will follow up with any questions or concerns.  I also injected the right heel today with 20 mg of Kenalog 5 mg of Marcaine to the point of maximal tenderness.  No complications.  Follow-up as needed

## 2019-07-31 ENCOUNTER — Ambulatory Visit: Payer: BC Managed Care – PPO | Admitting: Podiatry

## 2020-12-17 NOTE — Result Encounter Note (Signed)
 Continue same instruction.

## 2024-03-23 ENCOUNTER — Emergency Department

## 2024-03-23 ENCOUNTER — Inpatient Hospital Stay
Admission: EM | Admit: 2024-03-23 | Discharge: 2024-03-26 | DRG: 322 | Disposition: A | Discharge status: Home / Self Care | Attending: Internal Medicine | Admitting: Internal Medicine

## 2024-03-23 ENCOUNTER — Other Ambulatory Visit: Payer: Self-pay

## 2024-03-23 DIAGNOSIS — Z791 Long term (current) use of non-steroidal anti-inflammatories (NSAID): Secondary | ICD-10-CM

## 2024-03-23 DIAGNOSIS — I472 Ventricular tachycardia, unspecified: Secondary | ICD-10-CM | POA: Diagnosis present

## 2024-03-23 DIAGNOSIS — F17210 Nicotine dependence, cigarettes, uncomplicated: Secondary | ICD-10-CM | POA: Diagnosis present

## 2024-03-23 DIAGNOSIS — I2510 Atherosclerotic heart disease of native coronary artery without angina pectoris: Secondary | ICD-10-CM | POA: Diagnosis present

## 2024-03-23 DIAGNOSIS — Z823 Family history of stroke: Secondary | ICD-10-CM

## 2024-03-23 DIAGNOSIS — F17200 Nicotine dependence, unspecified, uncomplicated: Secondary | ICD-10-CM | POA: Diagnosis present

## 2024-03-23 DIAGNOSIS — Z7982 Long term (current) use of aspirin: Secondary | ICD-10-CM

## 2024-03-23 DIAGNOSIS — E785 Hyperlipidemia, unspecified: Secondary | ICD-10-CM | POA: Diagnosis present

## 2024-03-23 DIAGNOSIS — I214 Non-ST elevation (NSTEMI) myocardial infarction: Secondary | ICD-10-CM | POA: Diagnosis not present

## 2024-03-23 DIAGNOSIS — E669 Obesity, unspecified: Secondary | ICD-10-CM | POA: Diagnosis present

## 2024-03-23 DIAGNOSIS — Z6838 Body mass index (BMI) 38.0-38.9, adult: Secondary | ICD-10-CM

## 2024-03-23 DIAGNOSIS — Z79899 Other long term (current) drug therapy: Secondary | ICD-10-CM

## 2024-03-23 DIAGNOSIS — I700 Atherosclerosis of aorta: Secondary | ICD-10-CM | POA: Diagnosis present

## 2024-03-23 DIAGNOSIS — Z716 Tobacco abuse counseling: Secondary | ICD-10-CM

## 2024-03-23 DIAGNOSIS — Z8249 Family history of ischemic heart disease and other diseases of the circulatory system: Secondary | ICD-10-CM

## 2024-03-23 HISTORY — DX: Tobacco use: Z72.0

## 2024-03-23 HISTORY — DX: Morbid (severe) obesity due to excess calories: E66.01

## 2024-03-23 LAB — PROTIME-INR
INR: 0.9 (ref 0.8–1.2)
Prothrombin Time: 13 s (ref 11.4–15.2)

## 2024-03-23 LAB — COMPREHENSIVE METABOLIC PANEL WITH GFR
ALT: 25 U/L (ref 0–44)
AST: 35 U/L (ref 15–41)
Albumin: 4.1 g/dL (ref 3.5–5.0)
Alkaline Phosphatase: 47 U/L (ref 38–126)
Anion gap: 9 (ref 5–15)
BUN: 23 mg/dL — ABNORMAL HIGH (ref 6–20)
CO2: 23 mmol/L (ref 22–32)
Calcium: 9.3 mg/dL (ref 8.9–10.3)
Chloride: 106 mmol/L (ref 98–111)
Creatinine, Ser: 0.65 mg/dL (ref 0.44–1.00)
GFR, Estimated: >60 mL/min (ref >60)
Glucose, Bld: 99 mg/dL (ref 70–99)
Potassium: 3.9 mmol/L (ref 3.5–5.1)
Sodium: 138 mmol/L (ref 135–145)
Total Bilirubin: 0.6 mg/dL (ref 0.0–1.2)
Total Protein: 7.4 g/dL (ref 6.5–8.1)

## 2024-03-23 LAB — URINALYSIS, ROUTINE W REFLEX MICROSCOPIC
Bacteria, UA: NONE SEEN
Bilirubin Urine: NEGATIVE
Glucose, UA: NEGATIVE mg/dL
Hgb urine dipstick: NEGATIVE
Ketones, ur: 5 mg/dL — AB
Nitrite: NEGATIVE
Protein, ur: NEGATIVE mg/dL
Specific Gravity, Urine: >1.046 — ABNORMAL HIGH (ref 1.005–1.030)
pH: 5 (ref 5.0–8.0)

## 2024-03-23 LAB — CBC WITH DIFFERENTIAL/PLATELET
Abs Immature Granulocytes: 0.02 K/uL (ref 0.00–0.07)
Basophils Absolute: 0 K/uL (ref 0.0–0.1)
Basophils Relative: 0 %
Eosinophils Absolute: 0.1 K/uL (ref 0.0–0.5)
Eosinophils Relative: 1 %
HCT: 45.3 % (ref 36.0–46.0)
Hemoglobin: 15.3 g/dL — ABNORMAL HIGH (ref 12.0–15.0)
Immature Granulocytes: 0 %
Lymphocytes Relative: 30 %
Lymphs Abs: 2.1 K/uL (ref 0.7–4.0)
MCH: 31.7 pg (ref 26.0–34.0)
MCHC: 33.8 g/dL (ref 30.0–36.0)
MCV: 93.8 fL (ref 80.0–100.0)
Monocytes Absolute: 0.5 K/uL (ref 0.1–1.0)
Monocytes Relative: 7 %
Neutro Abs: 4.4 K/uL (ref 1.7–7.7)
Neutrophils Relative %: 62 %
Platelets: 266 K/uL (ref 150–400)
RBC: 4.83 MIL/uL (ref 3.87–5.11)
RDW: 12.5 % (ref 11.5–15.5)
WBC: 7.1 K/uL (ref 4.0–10.5)
nRBC: 0 % (ref 0.0–0.2)

## 2024-03-23 LAB — TYPE AND SCREEN
ABO/RH(D): A POS
Antibody Screen: NEGATIVE

## 2024-03-23 LAB — BRAIN NATRIURETIC PEPTIDE: B Natriuretic Peptide: 50 pg/mL (ref 0.0–100.0)

## 2024-03-23 LAB — TROPONIN I (HIGH SENSITIVITY)
Troponin I (High Sensitivity): 1512 ng/L (ref <18)
Troponin I (High Sensitivity): 1928 ng/L (ref <18)

## 2024-03-23 LAB — APTT: aPTT: 30 s (ref 24–36)

## 2024-03-23 MED ORDER — NITROGLYCERIN 0.4 MG SL SUBL: 0.4000 mg | SUBLINGUAL_TABLET | SUBLINGUAL | Status: DC | PRN

## 2024-03-23 MED ORDER — ASPIRIN 81 MG PO CHEW
324.0000 mg | CHEWABLE_TABLET | Freq: Once | ORAL | Status: AC
  Administered 2024-03-23: 324 mg via ORAL
  Filled 2024-03-23: qty 4

## 2024-03-23 MED ORDER — HEPARIN (PORCINE) 25000 UT/250ML-% IV SOLN
1450.0000 [IU]/h | INTRAVENOUS | Status: DC
  Administered 2024-03-23: 1000 [IU]/h via INTRAVENOUS
  Administered 2024-03-24: 1300 [IU]/h via INTRAVENOUS
  Filled 2024-03-23 (×2): qty 250

## 2024-03-23 MED ORDER — ASPIRIN 81 MG PO TBEC
81.0000 mg | DELAYED_RELEASE_TABLET | Freq: Every day | ORAL | Status: DC
  Administered 2024-03-24 – 2024-03-26 (×3): 81 mg via ORAL
  Filled 2024-03-23 (×3): qty 1

## 2024-03-23 MED ORDER — METOPROLOL TARTRATE 25 MG PO TABS
12.5000 mg | ORAL_TABLET | Freq: Two times a day (BID) | ORAL | Status: DC
  Administered 2024-03-23 – 2024-03-26 (×6): 12.5 mg via ORAL
  Filled 2024-03-23 (×6): qty 1

## 2024-03-23 MED ORDER — HEPARIN SODIUM (PORCINE) 5000 UNIT/ML IJ SOLN
4000.0000 [IU] | Freq: Once | INTRAMUSCULAR | Status: AC
  Administered 2024-03-23: 4000 [IU] via INTRAVENOUS
  Filled 2024-03-23: qty 1

## 2024-03-23 MED ORDER — ACETAMINOPHEN 325 MG PO TABS
650.0000 mg | ORAL_TABLET | ORAL | Status: DC | PRN
  Administered 2024-03-24 – 2024-03-25 (×2): 650 mg via ORAL
  Filled 2024-03-23 (×3): qty 2

## 2024-03-23 MED ORDER — IOHEXOL 350 MG/ML SOLN
75.0000 mL | Freq: Once | INTRAVENOUS | Status: AC | PRN
  Administered 2024-03-23: 75 mL via INTRAVENOUS

## 2024-03-23 MED ORDER — ZOLPIDEM TARTRATE 5 MG PO TABS: 5.0000 mg | ORAL_TABLET | Freq: Every evening | ORAL | Status: DC | PRN

## 2024-03-23 MED ORDER — ONDANSETRON HCL 4 MG/2ML IJ SOLN
4.0000 mg | Freq: Four times a day (QID) | INTRAMUSCULAR | Status: DC | PRN
  Administered 2024-03-24: 4 mg via INTRAVENOUS
  Filled 2024-03-23: qty 2

## 2024-03-23 MED ORDER — NICOTINE 21 MG/24HR TD PT24
21.0000 mg | MEDICATED_PATCH | Freq: Every day | TRANSDERMAL | Status: DC
  Administered 2024-03-24 – 2024-03-25 (×2): 21 mg via TRANSDERMAL
  Filled 2024-03-23 (×3): qty 1

## 2024-03-23 NOTE — Assessment & Plan Note (Signed)
 Nicotine patch and tobacco cessation counseling

## 2024-03-23 NOTE — ED Provider Notes (Signed)
 [facility] Provider Note    Event Date/Time   First MD Initiated Contact with Patient 03/23/24 1907     (approximate)   History   Neck Pain   HPI  Marisa Malone is a 53 y.o. female with no significant past medical history presents emergency department complaining of left-sided neck pain, left-sided chest pain that felt like someone was sitting on her chest 2 days ago, pain continues to radiate into the left upper arm along the bottom part of the arm but is not tender to touch.  Patient states pain is not reproduced with movement.  States now that she is getting a little bit scared about what is going on.  States she felt like she could not breathe but was not short of breath.  States it felt like an elephant on her chest.      Physical Exam   Triage Vital Signs: ED Triage Vitals  Encounter Vitals Group     BP 03/23/24 1646 (!) 158/81     Girls Systolic BP Percentile --      Girls Diastolic BP Percentile --      Boys Systolic BP Percentile --      Boys Diastolic BP Percentile --      Pulse Rate 03/23/24 1646 86     Resp 03/23/24 1646 16     Temp 03/23/24 1646 98.3 F (36.8 C)     Temp Source 03/23/24 1646 Oral     SpO2 03/23/24 1646 98 %     Weight 03/23/24 1648 225 lb (102.1 kg)     Height 03/23/24 1648 5' 4 (1.626 m)     Head Circumference --      Peak Flow --      Pain Score 03/23/24 1646 10     Pain Loc --      Pain Education --      Exclude from Growth Chart --     Most recent vital signs: Vitals:   03/23/24 2015 03/23/24 2118  BP:  (!) 142/87  Pulse: 73 78  Resp:  19  Temp:  98.1 F (36.7 C)  SpO2: 97% 98%     General: Awake, no distress.   CV:  Good peripheral perfusion. regular rate and  rhythm Resp:  Normal effort. Lungs CTA Abd:  No distention.   Other:      ED Results / Procedures / Treatments   Labs (all labs ordered are listed, but only abnormal results are displayed) Labs Reviewed  COMPREHENSIVE  METABOLIC PANEL WITH GFR - Abnormal; Notable for the following components:      Result Value   BUN 23 (*)    All other components within normal limits  CBC WITH DIFFERENTIAL/PLATELET - Abnormal; Notable for the following components:   Hemoglobin 15.3 (*)    All other components within normal limits  TROPONIN I (HIGH SENSITIVITY) - Abnormal; Notable for the following components:   Troponin I (High Sensitivity) 1,512 (*)    All other components within normal limits  URINALYSIS, ROUTINE W REFLEX MICROSCOPIC  APTT  BRAIN NATRIURETIC PEPTIDE  PROTIME-INR  TYPE AND SCREEN  TROPONIN I (HIGH SENSITIVITY)     EKG  EKG   RADIOLOGY Chest x-ray    PROCEDURES:   .Critical Care  Performed by: Gasper Devere ORN, PA-C Authorized by: Gasper Devere ORN, PA-C   Critical care provider statement:    Critical care time (minutes):  30   Critical care time was exclusive of:  Separately billable procedures and treating other patients   Critical care was necessary to treat or prevent imminent or life-threatening deterioration of the following conditions:  Cardiac failure   Critical care was time spent personally by me on the following activities:  Development of treatment plan with patient or surrogate, discussions with consultants, evaluation of patient's response to treatment, examination of patient, ordering and review of laboratory studies, ordering and review of radiographic studies, ordering and performing treatments and interventions, pulse oximetry, re-evaluation of patient's condition, review of old charts, interpretation of cardiac output measurements and obtaining history from patient or surrogate Comments:     Transferred care to Dr. Nicholaus when I moved her to the major side   Critical Care: Yes Chief Complaint  Patient presents with   Neck Pain      MEDICATIONS ORDERED IN ED: Medications  aspirin chewable tablet 324 mg (has no administration in time range)  heparin injection  4,000 Units (has no administration in time range)  iohexol (OMNIPAQUE) 350 MG/ML injection 75 mL (has no administration in time range)     IMPRESSION / MDM / ASSESSMENT AND PLAN / ED COURSE  I reviewed the triage vital signs and the nursing notes.                              Differential diagnosis includes, but is not limited to, chest wall pain, PE, MI, NSTEMI, muscle strain  Patient's presentation is most consistent with acute illness / injury with system symptoms.   Labs, chest x-ray, EKG  Clinical Course as of 03/23/24 2120  Sat Mar 23, 2024  2110 I met patient.  Patient denies any history of intracranial hemorrhage or GI bleeding.  I reviewed the indications benefits risk and alternatives of initiating a heparin bolus or drip and she voiced understanding.  We also start with CT angio imaging [HD]    Clinical Course User Index [HD] Nicholaus Rolland BRAVO, MD  EKG, shows normal sinus rhythm, no STEMI, no ST elevation, appears to be normal is my interpretation, see physician read  Troponin at 1500, will move patient to major side.  Report given to Dr. Nicholaus Patient will be admitted with NSTEMI. FINAL CLINICAL IMPRESSION(S) / ED DIAGNOSES   Final diagnoses:  NSTEMI (non-ST elevated myocardial infarction) (HCC)     Rx / DC Orders   ED Discharge Orders     None        Note:  This document was prepared using Dragon voice recognition software and may include unintentional dictation errors.    Gasper Devere ORN, PA-C 03/23/24 2120    Nicholaus Rolland BRAVO, MD 03/24/24 [phone]

## 2024-03-23 NOTE — H&P (Signed)
 History and Physical    Patient: Marisa Malone FMW:[id number] DOB: Oct 15, 1970 DOA: 03/23/2024 DOS: the patient was seen and examined on 03/23/2024 PCP: Pcp, No  Patient coming from: Home  Chief Complaint:  Chief Complaint  Patient presents with   Neck Pain    HPI: Marisa Malone is a 53 y.o. female with medical history significant for tobacco use disorder being admitted with an NSTEMI.  She presented with a 2-week history of left chest and upper arm radiating to the left side of the neck and jaw, like a toothache.  She felt it a couple times a day and was sometimes exertional, sometimes at rest and sometimes worse with taking a deep breath.  Would last up to 30 minutes.  She at first attributed it to possibly a pinched nerve in her neck.  Today while sitting watching TV the pain recurred and it was associated with nausea prompting the visit to the ED.  Denied vomiting or diaphoresis, shortness of breath, palpitations or lightheadedness.  Denies lower extremity pain or swelling. In the ED, BP 158/81 with otherwise normal vitals. First troponin 1512 CBC and CMP unremarkable except for slightly elevated hemoglobin of 15.3. EKG showed normal sinus rhythm at 69 with no ST-T wave changes CTA chest PE protocol negative for PE.  Noted mild coronary artery calcification, multifocal air trapping and mild bronchial wall thickening as well as mild to moderate degenerative changes throughout the thoracic spine  Patient treated with chewable aspirin 324 mg, started on a heparin infusion  Admission requested     Review of Systems: As mentioned in the history of present illness. All other systems reviewed and are negative.   Social History:  reports that she has been smoking cigarettes. She has never used smokeless tobacco. She reports that she does not currently use alcohol. She reports that she does not use drugs.  No Known Allergies  History reviewed. No pertinent family  history.  Prior to Admission medications   Medication Sig Start Date End Date Taking? Authorizing Provider  meloxicam  (MOBIC ) 15 MG tablet Take 1 tablet (15 mg total) by mouth daily. 05/09/18   Verta Royden DASEN, DPM    Physical Exam: Vitals:   03/23/24 1646 03/23/24 1648 03/23/24 2015 03/23/24 2118  BP: (!) 158/81   (!) 142/87  Pulse: 86  73 78  Resp: 16   19  Temp: 98.3 F (36.8 C)   98.1 F (36.7 C)  TempSrc: Oral     SpO2: 98%  97% 98%  Weight:  102.1 kg    Height:  5' 4 (1.626 m)     Physical Exam Vitals and nursing note reviewed.  Constitutional:      General: She is not in acute distress. HENT:     Head: Normocephalic and atraumatic.  Cardiovascular:     Rate and Rhythm: Normal rate and regular rhythm.     Heart sounds: Normal heart sounds.  Pulmonary:     Effort: Pulmonary effort is normal.     Breath sounds: Normal breath sounds.  Abdominal:     Palpations: Abdomen is soft.     Tenderness: There is no abdominal tenderness.  Neurological:     Mental Status: Mental status is at baseline.     Labs on Admission: I have personally reviewed following labs and imaging studies  CBC: Recent Labs  Lab 03/23/24 1950  WBC 7.1  NEUTROABS 4.4  HGB 15.3*  HCT 45.3  MCV 93.8  PLT 266  Basic Metabolic Panel: Recent Labs  Lab 03/23/24 1950  NA 138  K 3.9  CL 106  CO2 23  GLUCOSE 99  BUN 23*  CREATININE 0.65  CALCIUM 9.3   GFR: Estimated Creatinine Clearance: 94.6 mL/min (by C-G formula based on SCr of 0.65 mg/dL). Liver Function Tests: Recent Labs  Lab 03/23/24 1950  AST 35  ALT 25  ALKPHOS 47  BILITOT 0.6  PROT 7.4  ALBUMIN 4.1   No results for input(s): LIPASE, AMYLASE in the last 168 hours. No results for input(s): AMMONIA in the last 168 hours. Coagulation Profile: Recent Labs  Lab 03/23/24 2109  INR 0.9   Cardiac Enzymes: No results for input(s): CKTOTAL, CKMB, CKMBINDEX, TROPONINI in the last 168 hours. BNP (last 3  results) No results for input(s): PROBNP in the last 8760 hours. HbA1C: No results for input(s): HGBA1C in the last 72 hours. CBG: No results for input(s): GLUCAP in the last 168 hours. Lipid Profile: No results for input(s): CHOL, HDL, LDLCALC, TRIG, CHOLHDL, LDLDIRECT in the last 72 hours. Thyroid Function Tests: No results for input(s): TSH, T4TOTAL, FREET4, T3FREE, THYROIDAB in the last 72 hours. Anemia Panel: No results for input(s): VITAMINB12, FOLATE, FERRITIN, TIBC, IRON, RETICCTPCT in the last 72 hours. Urine analysis: No results found for: COLORURINE, APPEARANCEUR, LABSPEC, PHURINE, GLUCOSEU, HGBUR, BILIRUBINUR, KETONESUR, PROTEINUR, UROBILINOGEN, NITRITE, LEUKOCYTESUR  Radiological Exams on Admission: CT Angio Chest Pulmonary Embolism (PE) W or WO Contrast Result Date: 03/23/2024 EXAM: Cta Of The Chest With Contrast For Pe 03/23/2024 09:40:13 Pm TECHNIQUE: Cta Of The Chest Was Performed After The Administration Of Intravenous Contrast ( Iohexol (Omnipaque) 350 Mg/Ml Injection). Multiplanar Reformatted Images Are Provided For Review. Mip Images Are Provided For Review. Automated Exposure Control, Iterative Reconstruction, And/Or Weight-Based Adjustment Of The Ma/Kv Was Utilized To Reduce The Radiation Dose To As Low As Reasonably Achievable. COMPARISON: None Available. CLINICAL HISTORY: Evaluate For Pe. Pt To Ed For L Neck Pain And Arm Pain Since About 2 Weeks. Has Full Rom To Arms And Shoulders. Pain Is Sharp. No Lymphadenopathy To Neck Or Face. 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 FINDINGS: PULMONARY ARTERIES: Pulmonary Arteries Are Adequately Opacified For Evaluation. No Pulmonary Embolism. Central Pulmonary Arteries Are Of Normal Caliber. MEDIASTINUM: The Heart And Pericardium Demonstrate No Acute Abnormality. Global Cardiac Size Within Normal Limits. No  Pericardial Effusion. Mild Coronary Artery Calcification. LYMPH NODES: No Mention Of Lymphadenopathy. LUNGS AND PLEURA: Attenuation Of The Pulmonary Parenchyma Is Present In Keeping With Multifocal Air Trapping, Likely Related To Small Airway Disease. Mild Bronchial Wall Thickening Noted In Keeping With Airway Inflammation. The Lungs Are Not Without Acute Process. UPPER ABDOMEN: Limited Images Of The Upper Abdomen Are Unremarkable. SOFT TISSUES AND BONES: Mild-To-Moderate Degenerative Changes Seen Throughout The Thoracic Spine. No Acute Bone Or Soft Tissue Abnormality. Raf Score: * Aortic Atherosclerosis (Icd10-I70.0) IMPRESSION: 1. No pulmonary embolism. 2. Multifocal air trapping and mild bronchial wall thickening, likely related to small airway disease and airway inflammation. 3. Mild coronary artery calcification. 4. Mild-to-moderate degenerative changes throughout the thoracic spine. 5. raf score: Aortic atherosclerosis (ICD10-I70.0) Electronically signed by: Dorethia Molt MD 03/23/2024 10:02 PM EDT RP Workstation: [id number]   DG Chest 2 View Result Date: 03/23/2024 EXAM: 2 VIEW(S) XRAY OF THE CHEST 03/23/2024 08:42:29 PM COMPARISON: None available. CLINICAL HISTORY: Pain. PER ER NOTE; Pt to ED for L neck pain and arm pain since about 2 weeks. Has full ROM  to arms and shoulders. Pain is sharp. No lymphadenopathy to neck or face. 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 ; PATIENT STATES  SMOKER X 20 PLUS YEARS, PACK-PACK 1/2 DAILY  FINDINGS: LUNGS AND PLEURA: No focal pulmonary opacity. No pulmonary edema. No pleural effusion. No pneumothorax. HEART AND MEDIASTINUM: No acute abnormality of the cardiac and mediastinal silhouettes. BONES AND SOFT TISSUES: No acute osseous abnormality. IMPRESSION: 1. No acute cardiopulmonary process. Electronically signed by: Pinkie Pebbles MD 03/23/2024 08:51 PM EDT RP Workstation: [id number]   Data Reviewed for  HPI: Relevant notes from primary care and specialist visits, past discharge summaries as available in EHR, including Care Everywhere. Prior diagnostic testing as pertinent to current admission diagnoses Updated medications and problem lists for reconciliation ED course, including vitals, labs, imaging, treatment and response to treatment Triage notes, nursing and pharmacy notes and ED provider's notes Notable results as noted above in HPI      Assessment and Plan: * NSTEMI (non-ST elevated myocardial infarction) (HCC) Chest pain with typical CTS and troponin over 1500.  EKG nonacute  Risk factors of nicotine dependence, BMI 38 Received chewable aspirin in the ED Continue heparin infusion Aspirin, metoprolol and statin Nitroglycerin sublingual as needed chest pain with morphine for breakthrough Cardiology consult  Tobacco use disorder Nicotine patch and tobacco cessation counseling  Obesity (BMI 30-39.9) Dietary and lifestyle modification        DVT prophylaxis: Heparin infusion  Consults: [facility] cardiology  Advance Care Planning: full code  Family Communication: none  Disposition Plan: Back to previous home environment  Severity of Illness: The appropriate patient status for this patient is OBSERVATION. Observation status is judged to be reasonable and necessary in order to provide the required intensity of service to ensure the patient's safety. The patient's presenting symptoms, physical exam findings, and initial radiographic and laboratory data in the context of their medical condition is felt to place them at decreased risk for further clinical deterioration. Furthermore, it is anticipated that the patient will be medically stable for discharge from the hospital within 2 midnights of admission.   Author: Delayne LULLA Solian, MD 03/23/2024 10:36 PM  For on call review [email or web address].

## 2024-03-23 NOTE — ED Provider Notes (Signed)
  Clinical Course as of 03/23/24 2227  Sat Mar 23, 2024  2110 I met patient.  Patient denies any history of intracranial hemorrhage or GI bleeding.  I reviewed the indications benefits risk and alternatives of initiating a heparin bolus or drip and she voiced understanding.  We also start with CT angio imaging [HD]  2219 CT Angio Chest Pulmonary Embolism (PE) W or WO Contrast No PE [HD]  2219 Case discussed with hospitalist for admission [HD]    Clinical Course User Index [HD] Nicholaus Rolland BRAVO, MD   Patient signed out to me by flex provider as patient had an elevated troponin.  I reviewed the EKG as follows:  EKG and rhythm strip are interpreted by myself:   EKG: [Normal sinus rhythm] at heart rate of 69, normal QRS duration, QTc 426, normal ST segments and T waves no ectopy EKG not consistent with Acute STEMI Rhythm strip: NSR in lead II  I did scan hold for CT PE which was negative.  I have initiated a heparin drip with bolus with the patient.  I have called her into the hospitalist for admission  .Critical Care  Performed by: Nicholaus Rolland BRAVO, MD Authorized by: Nicholaus Rolland BRAVO, MD   Critical care provider statement:    Critical care time (minutes):  35   Critical care was necessary to treat or prevent imminent or life-threatening deterioration of the following conditions:  Cardiac failure   Critical care was time spent personally by me on the following activities:  Development of treatment plan with patient or surrogate, discussions with consultants, evaluation of patient's response to treatment, examination of patient, ordering and review of laboratory studies, ordering and review of radiographic studies, ordering and performing treatments and interventions, pulse oximetry, re-evaluation of patient's condition and review of old charts Comments:     Need to initiate heparin bolus with drip and have discussion of risks benefits alternatives for NSTEMI      Nicholaus Rolland BRAVO,  MD 03/23/24 2229

## 2024-03-23 NOTE — ED Triage Notes (Signed)
 Pt to ED for L neck pain and arm pain since about 2 weeks. Has full ROM to arms and shoulders. Pain is sharp. No lymphadenopathy to neck or face. 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

## 2024-03-23 NOTE — Assessment & Plan Note (Signed)
 Dietary and lifestyle modification

## 2024-03-23 NOTE — Consult Note (Signed)
 Pharmacy Consult Note - Anticoagulation  Pharmacy Consult for heparin Indication: chest pain/ACS  PATIENT MEASUREMENTS: Height: 5' 4 (162.6 cm) Weight: 102.1 kg (225 lb) IBW/kg (Calculated) : 54.7 HEPARIN DW (KG): 78.5  VITAL SIGNS: Temp: 98.3 F (36.8 C) (10/25 1646) Temp Source: Oral (10/25 1646) BP: 158/81 (10/25 1646) Pulse Rate: 73 (10/25 2015)  Recent Labs    03/23/24 1950  HGB 15.3*  HCT 45.3  PLT 266  CREATININE 0.65  TROPONINIHS 1,512*    Estimated Creatinine Clearance: 94.6 mL/min (by C-G formula based on SCr of 0.65 mg/dL).  PAST MEDICAL HISTORY: History reviewed. No pertinent past medical history.  ASSESSMENT: 53 y.o. female with PMH including obesity, tobacco use is presenting with left-sided neck and arm pain. hsTn elevated. Patient is not on chronic anticoagulation per chart review. Pharmacy has been consulted to initiate and manage heparin intravenous infusion.  Pertinent medications: Aspirin 325mg  PO x1  Goal(s) of therapy: Heparin level 0.3 - 0.7 units/mL Monitor platelets by anticoagulation protocol: Yes   Baseline anticoagulation labs: Recent Labs    03/23/24 1950  HGB 15.3*  PLT 266   Baseline aPTT and INR have also been ordered.  Date Time aPTT/HL Rate/Comment     PLAN: Give 4000 units bolus x1; then start heparin infusion at 1000 units/hour. Check heparin level in 6 hours, then daily once at least two levels are consecutively therapeutic. Monitor CBC daily while on heparin infusion.  Will M. Lenon, PharmD, BCPS Clinical Pharmacist 03/23/2024 9:13 PM

## 2024-03-23 NOTE — Assessment & Plan Note (Signed)
 Chest pain with typical CTS and troponin over 1500.  EKG nonacute  Risk factors of nicotine dependence, BMI 38 Received chewable aspirin in the ED Continue heparin infusion Aspirin, metoprolol and statin Nitroglycerin sublingual as needed chest pain with morphine for breakthrough Cardiology consult

## 2024-03-24 ENCOUNTER — Encounter: Payer: Self-pay | Admitting: Internal Medicine

## 2024-03-24 ENCOUNTER — Inpatient Hospital Stay (HOSPITAL_COMMUNITY)
Admit: 2024-03-24 | Discharge: 2024-03-24 | Disposition: A | Discharge status: Home / Self Care | Attending: Nurse Practitioner | Admitting: Nurse Practitioner

## 2024-03-24 DIAGNOSIS — Z7982 Long term (current) use of aspirin: Secondary | ICD-10-CM | POA: Diagnosis not present

## 2024-03-24 DIAGNOSIS — F17200 Nicotine dependence, unspecified, uncomplicated: Secondary | ICD-10-CM | POA: Diagnosis not present

## 2024-03-24 DIAGNOSIS — Z8249 Family history of ischemic heart disease and other diseases of the circulatory system: Secondary | ICD-10-CM | POA: Diagnosis not present

## 2024-03-24 DIAGNOSIS — R079 Chest pain, unspecified: Secondary | ICD-10-CM | POA: Diagnosis not present

## 2024-03-24 DIAGNOSIS — Z79899 Other long term (current) drug therapy: Secondary | ICD-10-CM | POA: Diagnosis not present

## 2024-03-24 DIAGNOSIS — Z716 Tobacco abuse counseling: Secondary | ICD-10-CM | POA: Diagnosis not present

## 2024-03-24 DIAGNOSIS — Z823 Family history of stroke: Secondary | ICD-10-CM | POA: Diagnosis not present

## 2024-03-24 DIAGNOSIS — Z791 Long term (current) use of non-steroidal anti-inflammatories (NSAID): Secondary | ICD-10-CM | POA: Diagnosis not present

## 2024-03-24 DIAGNOSIS — I2510 Atherosclerotic heart disease of native coronary artery without angina pectoris: Secondary | ICD-10-CM | POA: Diagnosis present

## 2024-03-24 DIAGNOSIS — E669 Obesity, unspecified: Secondary | ICD-10-CM

## 2024-03-24 DIAGNOSIS — F17210 Nicotine dependence, cigarettes, uncomplicated: Secondary | ICD-10-CM | POA: Diagnosis present

## 2024-03-24 DIAGNOSIS — I472 Ventricular tachycardia, unspecified: Secondary | ICD-10-CM | POA: Diagnosis present

## 2024-03-24 DIAGNOSIS — I214 Non-ST elevation (NSTEMI) myocardial infarction: Secondary | ICD-10-CM

## 2024-03-24 DIAGNOSIS — Z720 Tobacco use: Secondary | ICD-10-CM | POA: Diagnosis not present

## 2024-03-24 DIAGNOSIS — E785 Hyperlipidemia, unspecified: Secondary | ICD-10-CM | POA: Diagnosis present

## 2024-03-24 DIAGNOSIS — I700 Atherosclerosis of aorta: Secondary | ICD-10-CM | POA: Diagnosis present

## 2024-03-24 DIAGNOSIS — Z6838 Body mass index (BMI) 38.0-38.9, adult: Secondary | ICD-10-CM | POA: Diagnosis not present

## 2024-03-24 LAB — ECHOCARDIOGRAM COMPLETE
AR max vel: 1.85 cm2
AV Peak grad: 7.4 mmHg
Ao pk vel: 1.36 m/s
Area-P 1/2: 3.07 cm2
Height: 64 in
S' Lateral: 2.8 cm
Weight: 3600 [oz_av]

## 2024-03-24 LAB — HIV ANTIBODY (ROUTINE TESTING W REFLEX): HIV Screen 4th Generation wRfx: NONREACTIVE

## 2024-03-24 LAB — CBC
HCT: 42.2 % (ref 36.0–46.0)
Hemoglobin: 14.2 g/dL (ref 12.0–15.0)
MCH: 31.6 pg (ref 26.0–34.0)
MCHC: 33.6 g/dL (ref 30.0–36.0)
MCV: 93.8 fL (ref 80.0–100.0)
Platelets: 247 K/uL (ref 150–400)
RBC: 4.5 MIL/uL (ref 3.87–5.11)
RDW: 12.6 % (ref 11.5–15.5)
WBC: 8 K/uL (ref 4.0–10.5)
nRBC: 0 % (ref 0.0–0.2)

## 2024-03-24 LAB — HEPARIN LEVEL (UNFRACTIONATED)
Heparin Unfractionated: 0.14 [IU]/mL — ABNORMAL LOW (ref 0.30–0.70)
Heparin Unfractionated: 0.4 [IU]/mL (ref 0.30–0.70)
Heparin Unfractionated: 0.31 [IU]/mL (ref 0.30–0.70)

## 2024-03-24 MED ORDER — MORPHINE SULFATE (PF) 2 MG/ML IV SOLN
1.0000 mg | INTRAVENOUS | Status: DC | PRN
Start: 2024-03-24 — End: 2024-03-26

## 2024-03-24 MED ORDER — MORPHINE SULFATE (PF) 2 MG/ML IV SOLN: 1.0000 mg | INTRAVENOUS | Status: DC | PRN

## 2024-03-24 MED ORDER — FREE WATER
500.0000 mL | Freq: Once | Status: AC
  Administered 2024-03-25: 500 mL via ORAL

## 2024-03-24 MED ORDER — ATORVASTATIN CALCIUM 80 MG PO TABS
80.0000 mg | ORAL_TABLET | Freq: Every day | ORAL | Status: DC
  Administered 2024-03-24 – 2024-03-26 (×3): 80 mg via ORAL
  Filled 2024-03-24 (×3): qty 1

## 2024-03-24 MED ORDER — MORPHINE SULFATE (PF) 2 MG/ML IV SOLN: 2.0000 mg | INTRAVENOUS | Status: DC | PRN

## 2024-03-24 MED ORDER — CALCIUM CARBONATE ANTACID 500 MG PO CHEW
1.0000 | CHEWABLE_TABLET | Freq: Three times a day (TID) | ORAL | Status: DC
  Administered 2024-03-24: 200 mg via ORAL
  Filled 2024-03-24 (×5): qty 1

## 2024-03-24 MED ORDER — HEPARIN BOLUS VIA INFUSION
2350.0000 [IU] | Freq: Once | INTRAVENOUS | Status: AC
  Administered 2024-03-24: 2350 [IU] via INTRAVENOUS
  Filled 2024-03-24: qty 2350

## 2024-03-24 MED ORDER — ASPIRIN 81 MG PO CHEW
81.0000 mg | CHEWABLE_TABLET | ORAL | Status: AC
  Administered 2024-03-25: 81 mg via ORAL
  Filled 2024-03-24: qty 1

## 2024-03-24 MED ORDER — NITROGLYCERIN 2 % TD OINT
0.5000 [in_us] | TOPICAL_OINTMENT | Freq: Four times a day (QID) | TRANSDERMAL | Status: DC
  Administered 2024-03-24 – 2024-03-25 (×4): 0.5 [in_us] via TOPICAL
  Filled 2024-03-24 (×4): qty 1

## 2024-03-24 NOTE — Consult Note (Signed)
 Cardiology Consult    Patient ID: Marisa Malone MRN: [id number], DOB/AGE: [birth year]   Admit date: 03/23/2024 Date of Consult: 03/24/2024  Primary Physician: Freddrick Johns Primary Cardiologist: Evalene Lunger, MD  Cardiology APP:  Vivienne Lonni Ingle, NP  Requesting Provider: HILARIO Solian, MD  Patient Profile    Marisa Malone is a 53 y.o. female with a history of tobacco abuse and morbid obesity, who is being seen today for the evaluation of non-STEMI at the request of Dr. Solian.  Past Medical History   Subjective  Past Medical History:  Diagnosis Date   Morbid obesity (HCC)    Tobacco abuse     History reviewed. No pertinent surgical history.   Allergies  No Known Allergies     History of Present Illness   53 year old female with a history of tobacco abuse and morbid obesity who presented to the emergency department March 23, 2024 with a 2-week history of intermittent exertional neck, chest, and L arm pain.  Symptoms started in her neck but later moved to her shoulder, mid chest, and later L arm/axillary, occurring daily, lasting about 30 mins, and resolving w/ rest.  No dyspnea.  She initially thought she pinched a nerve in her neck.  On the day of admission, she was watching TV and had recurrent sharp L arm and L chest pain associated with nausea.  After a few hours, she drove herself to the ED.  On arrival, she was afebrile and hypertensive at 158/81.  ECG showed sinus rhythm at 69 with left atrial enlargement.  Labs notable for troponin of 1512 with follow-up of 1928.  BNP was normal at 50.  Lab work otherwise unremarkable.  Checks x-ray showed no acute cardiopulmonary process.  CTA of the chest was negative for PE but showed mild coronary calcifications, aortic atherosclerosis, and multifocal air trapping and mild bronchial wall thickening.  She was given aspirin and placed on heparin and beta-blocker therapy.  She had up to 8 beats of NSVT on tele overnight.  She  is currently chest/arm/neck pain free.  Inpatient Medications   Subjective    aspirin EC  81 mg Oral Daily   atorvastatin  80 mg Oral Daily   calcium carbonate  1 tablet Oral TID   metoprolol tartrate  12.5 mg Oral BID   nicotine  21 mg Transdermal Daily    Family History    Family History  Problem Relation Age of Onset   Stroke Mother 5   Heart failure Brother        adopted   She indicated that her mother is alive. She indicated that her father is deceased. She indicated that her brother is deceased.   Social History    Social History   Socioeconomic History   Marital status: Married    Spouse name: Not on file   Number of children: Not on file   Years of education: Not on file   Highest education level: Not on file  Occupational History   Not on file  Tobacco Use   Smoking status: Every Day    Current packs/day: 2.00    Average packs/day: 2.0 packs/day for 37.8 years (75.6 ttl pk-yrs)    Types: Cigarettes    Start date: 19   Smokeless tobacco: Never  Substance and Sexual Activity   Alcohol use: Not Currently   Drug use: Never   Sexual activity: Not on file  Other Topics Concern   Not on file  Social History Narrative  Lives locally w/ mother.  Does not routinely exercise.  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   Social Drivers of Corporate Investment Banker Strain: Not on file  Food Insecurity: No Food Insecurity (03/23/2024)   Hunger Vital Sign    Worried About Running Out of Food in the Last Year: Never true    Ran Out of Food in the Last Year: Never true  Transportation Needs: No Transportation Needs (03/23/2024)   PRAPARE - Administrator, Civil Service (Medical): No    Lack of Transportation (Non-Medical): No  Physical Activity: Not on file  Stress: Not on file  Social Connections: Not on file  Intimate Partner Violence: Not At Risk (03/23/2024)   Humiliation, Afraid, Rape, and Kick questionnaire    Fear of  Current or Ex-Partner: No    Emotionally Abused: No    Physically Abused: No    Sexually Abused: No     Review of Systems    General:  No chills, fever, night sweats or weight changes.  Cardiovascular:  +++ chest pain, no dyspnea on exertion, edema, orthopnea, palpitations, paroxysmal nocturnal dyspnea. Dermatological: No rash, lesions/masses Respiratory: No cough, dyspnea Urologic: No hematuria, dysuria Abdominal:   +++ nausea, no vomiting, diarrhea, bright red blood per rectum, melena, or hematemesis Neurologic:  No visual changes, wkns, changes in mental status. All other systems reviewed and are otherwise negative except as noted above.     Objective   Physical Exam    Blood pressure 100/61, pulse 60, temperature 98.4 F (36.9 C), temperature source Oral, resp. rate 20, height 5' 4 (1.626 m), weight 102.1 kg, SpO2 100%.  General: Pleasant, NAD Psych: Normal affect. Neuro: Alert and oriented X 3. Moves all extremities spontaneously. HEENT: Normal  Neck: Supple without bruits or JVD. Lungs:  Resp regular and unlabored, CTA. Heart: RRR no s3, s4, or murmurs. Abdomen: Soft, non-tender, non-distended, BS + x 4.  Extremities: No clubbing, cyanosis or edema. DP/PT2+, Radials 2+ and equal bilaterally.  Labs    Cardiac Enzymes Recent Labs  Lab 03/23/24 1950 03/23/24 2142  TROPONINIHS 1,512* 1,928*     BNP    Component Value Date/Time   BNP 50.0 03/23/2024 2109    Lab Results  Component Value Date   WBC 8.0 03/24/2024   HGB 14.2 03/24/2024   HCT 42.2 03/24/2024   MCV 93.8 03/24/2024   PLT 247 03/24/2024    Recent Labs  Lab 03/23/24 1950  NA 138  K 3.9  CL 106  CO2 23  BUN 23*  CREATININE 0.65  CALCIUM 9.3  PROT 7.4  BILITOT 0.6  ALKPHOS 47  ALT 25  AST 35  GLUCOSE 99     Radiology Studies    CT Angio Chest Pulmonary Embolism (PE) W or WO Contrast Result Date: 03/23/2024 EXAM: Cta Of The Chest With Contrast For Pe 03/23/2024 09:40:13 Pm  TECHNIQUE: Cta Of The Chest Was Performed After The Administration Of Intravenous Contrast ( Iohexol (Omnipaque) 350 Mg/Ml Injection). Multiplanar Reformatted Images Are Provided For Review. Mip Images Are Provided For Review. Automated Exposure Control, Iterative Reconstruction, And/Or Weight-Based Adjustment Of The Ma/Kv Was Utilized To Reduce The Radiation Dose To As Low As Reasonably Achievable. COMPARISON: None Available. CLINICAL HISTORY: Evaluate For Pe. Pt To Ed For L Neck Pain And Arm Pain Since About 2 Weeks. Has Full Rom To Arms And Shoulders. Pain Is Sharp. No Lymphadenopathy To Neck Or Face. States She Sits In A  Gaming Chair A Lot To Play Video Games With Arms Extended Over The Table. FINDINGS: PULMONARY ARTERIES: Pulmonary Arteries Are Adequately Opacified For Evaluation. No Pulmonary Embolism. Central Pulmonary Arteries Are Of Normal Caliber. MEDIASTINUM: The Heart And Pericardium Demonstrate No Acute Abnormality. Global Cardiac Size Within Normal Limits. No Pericardial Effusion. Mild Coronary Artery Calcification. LYMPH NODES: No Mention Of Lymphadenopathy. LUNGS AND PLEURA: Attenuation Of The Pulmonary Parenchyma Is Present In Keeping With Multifocal Air Trapping, Likely Related To Small Airway Disease. Mild Bronchial Wall Thickening Noted In Keeping With Airway Inflammation. The Lungs Are Not Without Acute Process. UPPER ABDOMEN: Limited Images Of The Upper Abdomen Are Unremarkable. SOFT TISSUES AND BONES: Mild-To-Moderate Degenerative Changes Seen Throughout The Thoracic Spine. No Acute Bone Or Soft Tissue Abnormality. Raf Score: * Aortic Atherosclerosis (Icd10-I70.0) IMPRESSION: 1. No pulmonary embolism. 2. Multifocal air trapping and mild bronchial wall thickening, likely related to small airway disease and airway inflammation. 3. Mild coronary artery calcification. 4. Mild-to-moderate degenerative changes throughout the thoracic spine. 5. raf score: Aortic atherosclerosis (ICD10-I70.0)  Electronically signed by: Dorethia Molt MD 03/23/2024 10:02 PM EDT RP Workstation: [id number]   DG Chest 2 View Result Date: 03/23/2024 EXAM: 2 VIEW(S) XRAY OF THE CHEST 03/23/2024 08:42:29 PM COMPARISON: None available. CLINICAL HISTORY: Pain. PER ER NOTE; Pt to ED for L neck pain and arm pain since about 2 weeks. Has full ROM to arms and shoulders. Pain is sharp. No lymphadenopathy to neck or face. 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 ; PATIENT STATES  SMOKER X 20 PLUS YEARS, PACK-PACK 1/2 DAILY  FINDINGS: LUNGS AND PLEURA: No focal pulmonary opacity. No pulmonary edema. No pleural effusion. No pneumothorax. HEART AND MEDIASTINUM: No acute abnormality of the cardiac and mediastinal silhouettes. BONES AND SOFT TISSUES: No acute osseous abnormality. IMPRESSION: 1. No acute cardiopulmonary process. Electronically signed by: Pinkie Pebbles MD 03/23/2024 08:51 PM EDT RP Workstation: [id number]      ECG & Cardiac Imaging    sinus rhythm at 69 with left atrial enlargement. - personally reviewed.  Assessment & Plan    1.  Non-STEMI:  Pt w/o prior cardiac hx presented 10/25 w/ a 2 wk h/o exertional neck, shoulder, chest, and arm pain.  She had symptoms @ rest on day of admission, assoc w/ nausea.  Here, ECG w/o acute ST/T changes.  CTA chest neg for PE but notable for cor Ca2+ and Ao atherosclerosis.  HsTrop 1512  1928.  Currently c/p free, but has had intermittent mild pressure.  Cont asa, ? blocker, heparin.  I've added high potency statin.  BP soft this AM, thus hold off on nitrates.  The patient understands that risks include but are not limited to stroke (1 in 1000), death (1 in 1000), kidney failure [usually temporary] (1 in 500), bleeding (1 in 200), allergic reaction [possibly serious] (1 in 200), and agrees to proceed.  Will schedule for tomorrow or sooner if recurrent/recalcitrant pain.  2.  Elevated blood pressure without history of  hypertension:  BP high on admission.  Soft this AM.  Follow on low dose ? blocker.  3.  Lipids: Normal LFTs.  Follow-up lipids.  Adding hypodensity statin therapy in the setting of #1 along with coronary calcium and aortic atherosclerosis.  4.  Morbid obesity:  Will benefit from cardiac rehab.  5.  Tob Abuse:  smoking up to 2ppd since age 49.  Cessation advised.  Risk Assessment/Risk Scores:  TIMI Risk Score for Unstable Angina or Non-ST Elevation MI:   The patient's TIMI risk score is 3, which indicates a 13% risk of all cause mortality, new or recurrent myocardial infarction or need for urgent revascularization in the next 14 days.      Signed, Lonni Meager, NP 03/24/2024, 8:35 AM  For questions or updates, please contact   Please consult [email or web address] for contact info under Cardiology/STEMI.

## 2024-03-24 NOTE — H&P (View-Only) (Signed)
 Cardiology Consult    Patient ID: Marisa Malone MRN: [id number], DOB/AGE: [birth year]   Admit date: 03/23/2024 Date of Consult: 03/24/2024  Primary Physician: Freddrick Johns Primary Cardiologist: Evalene Lunger, MD  Cardiology APP:  Vivienne Lonni Ingle, NP  Requesting Provider: HILARIO Solian, MD  Patient Profile    Marisa Malone is a 53 y.o. female with a history of tobacco abuse and morbid obesity, who is being seen today for the evaluation of non-STEMI at the request of Dr. Solian.  Past Medical History   Subjective  Past Medical History:  Diagnosis Date   Morbid obesity (HCC)    Tobacco abuse     History reviewed. No pertinent surgical history.   Allergies  No Known Allergies     History of Present Illness   53 year old female with a history of tobacco abuse and morbid obesity who presented to the emergency department March 23, 2024 with a 2-week history of intermittent exertional neck, chest, and L arm pain.  Symptoms started in her neck but later moved to her shoulder, mid chest, and later L arm/axillary, occurring daily, lasting about 30 mins, and resolving w/ rest.  No dyspnea.  She initially thought she pinched a nerve in her neck.  On the day of admission, she was watching TV and had recurrent sharp L arm and L chest pain associated with nausea.  After a few hours, she drove herself to the ED.  On arrival, she was afebrile and hypertensive at 158/81.  ECG showed sinus rhythm at 69 with left atrial enlargement.  Labs notable for troponin of 1512 with follow-up of 1928.  BNP was normal at 50.  Lab work otherwise unremarkable.  Checks x-ray showed no acute cardiopulmonary process.  CTA of the chest was negative for PE but showed mild coronary calcifications, aortic atherosclerosis, and multifocal air trapping and mild bronchial wall thickening.  She was given aspirin and placed on heparin and beta-blocker therapy.  She had up to 8 beats of NSVT on tele overnight.  She  is currently chest/arm/neck pain free.  Inpatient Medications   Subjective    aspirin EC  81 mg Oral Daily   atorvastatin  80 mg Oral Daily   calcium carbonate  1 tablet Oral TID   metoprolol tartrate  12.5 mg Oral BID   nicotine  21 mg Transdermal Daily    Family History    Family History  Problem Relation Age of Onset   Stroke Mother 5   Heart failure Brother        adopted   She indicated that her mother is alive. She indicated that her father is deceased. She indicated that her brother is deceased.   Social History    Social History   Socioeconomic History   Marital status: Married    Spouse name: Not on file   Number of children: Not on file   Years of education: Not on file   Highest education level: Not on file  Occupational History   Not on file  Tobacco Use   Smoking status: Every Day    Current packs/day: 2.00    Average packs/day: 2.0 packs/day for 37.8 years (75.6 ttl pk-yrs)    Types: Cigarettes    Start date: 19   Smokeless tobacco: Never  Substance and Sexual Activity   Alcohol use: Not Currently   Drug use: Never   Sexual activity: Not on file  Other Topics Concern   Not on file  Social History Narrative  Lives locally w/ mother.  Does not routinely exercise.  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   Social Drivers of Corporate Investment Banker Strain: Not on file  Food Insecurity: No Food Insecurity (03/23/2024)   Hunger Vital Sign    Worried About Running Out of Food in the Last Year: Never true    Ran Out of Food in the Last Year: Never true  Transportation Needs: No Transportation Needs (03/23/2024)   PRAPARE - Administrator, Civil Service (Medical): No    Lack of Transportation (Non-Medical): No  Physical Activity: Not on file  Stress: Not on file  Social Connections: Not on file  Intimate Partner Violence: Not At Risk (03/23/2024)   Humiliation, Afraid, Rape, and Kick questionnaire    Fear of  Current or Ex-Partner: No    Emotionally Abused: No    Physically Abused: No    Sexually Abused: No     Review of Systems    General:  No chills, fever, night sweats or weight changes.  Cardiovascular:  +++ chest pain, no dyspnea on exertion, edema, orthopnea, palpitations, paroxysmal nocturnal dyspnea. Dermatological: No rash, lesions/masses Respiratory: No cough, dyspnea Urologic: No hematuria, dysuria Abdominal:   +++ nausea, no vomiting, diarrhea, bright red blood per rectum, melena, or hematemesis Neurologic:  No visual changes, wkns, changes in mental status. All other systems reviewed and are otherwise negative except as noted above.     Objective   Physical Exam    Blood pressure 100/61, pulse 60, temperature 98.4 F (36.9 C), temperature source Oral, resp. rate 20, height 5' 4 (1.626 m), weight 102.1 kg, SpO2 100%.  General: Pleasant, NAD Psych: Normal affect. Neuro: Alert and oriented X 3. Moves all extremities spontaneously. HEENT: Normal  Neck: Supple without bruits or JVD. Lungs:  Resp regular and unlabored, CTA. Heart: RRR no s3, s4, or murmurs. Abdomen: Soft, non-tender, non-distended, BS + x 4.  Extremities: No clubbing, cyanosis or edema. DP/PT2+, Radials 2+ and equal bilaterally.  Labs    Cardiac Enzymes Recent Labs  Lab 03/23/24 1950 03/23/24 2142  TROPONINIHS 1,512* 1,928*     BNP    Component Value Date/Time   BNP 50.0 03/23/2024 2109    Lab Results  Component Value Date   WBC 8.0 03/24/2024   HGB 14.2 03/24/2024   HCT 42.2 03/24/2024   MCV 93.8 03/24/2024   PLT 247 03/24/2024    Recent Labs  Lab 03/23/24 1950  NA 138  K 3.9  CL 106  CO2 23  BUN 23*  CREATININE 0.65  CALCIUM 9.3  PROT 7.4  BILITOT 0.6  ALKPHOS 47  ALT 25  AST 35  GLUCOSE 99     Radiology Studies    CT Angio Chest Pulmonary Embolism (PE) W or WO Contrast Result Date: 03/23/2024 EXAM: Cta Of The Chest With Contrast For Pe 03/23/2024 09:40:13 Pm  TECHNIQUE: Cta Of The Chest Was Performed After The Administration Of Intravenous Contrast ( Iohexol (Omnipaque) 350 Mg/Ml Injection). Multiplanar Reformatted Images Are Provided For Review. Mip Images Are Provided For Review. Automated Exposure Control, Iterative Reconstruction, And/Or Weight-Based Adjustment Of The Ma/Kv Was Utilized To Reduce The Radiation Dose To As Low As Reasonably Achievable. COMPARISON: None Available. CLINICAL HISTORY: Evaluate For Pe. Pt To Ed For L Neck Pain And Arm Pain Since About 2 Weeks. Has Full Rom To Arms And Shoulders. Pain Is Sharp. No Lymphadenopathy To Neck Or Face. States She Sits In A  Gaming Chair A Lot To Play Video Games With Arms Extended Over The Table. FINDINGS: PULMONARY ARTERIES: Pulmonary Arteries Are Adequately Opacified For Evaluation. No Pulmonary Embolism. Central Pulmonary Arteries Are Of Normal Caliber. MEDIASTINUM: The Heart And Pericardium Demonstrate No Acute Abnormality. Global Cardiac Size Within Normal Limits. No Pericardial Effusion. Mild Coronary Artery Calcification. LYMPH NODES: No Mention Of Lymphadenopathy. LUNGS AND PLEURA: Attenuation Of The Pulmonary Parenchyma Is Present In Keeping With Multifocal Air Trapping, Likely Related To Small Airway Disease. Mild Bronchial Wall Thickening Noted In Keeping With Airway Inflammation. The Lungs Are Not Without Acute Process. UPPER ABDOMEN: Limited Images Of The Upper Abdomen Are Unremarkable. SOFT TISSUES AND BONES: Mild-To-Moderate Degenerative Changes Seen Throughout The Thoracic Spine. No Acute Bone Or Soft Tissue Abnormality. Raf Score: * Aortic Atherosclerosis (Icd10-I70.0) IMPRESSION: 1. No pulmonary embolism. 2. Multifocal air trapping and mild bronchial wall thickening, likely related to small airway disease and airway inflammation. 3. Mild coronary artery calcification. 4. Mild-to-moderate degenerative changes throughout the thoracic spine. 5. raf score: Aortic atherosclerosis (ICD10-I70.0)  Electronically signed by: Dorethia Molt MD 03/23/2024 10:02 PM EDT RP Workstation: [id number]   DG Chest 2 View Result Date: 03/23/2024 EXAM: 2 VIEW(S) XRAY OF THE CHEST 03/23/2024 08:42:29 PM COMPARISON: None available. CLINICAL HISTORY: Pain. PER ER NOTE; Pt to ED for L neck pain and arm pain since about 2 weeks. Has full ROM to arms and shoulders. Pain is sharp. No lymphadenopathy to neck or face. 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 ; PATIENT STATES  SMOKER X 20 PLUS YEARS, PACK-PACK 1/2 DAILY  FINDINGS: LUNGS AND PLEURA: No focal pulmonary opacity. No pulmonary edema. No pleural effusion. No pneumothorax. HEART AND MEDIASTINUM: No acute abnormality of the cardiac and mediastinal silhouettes. BONES AND SOFT TISSUES: No acute osseous abnormality. IMPRESSION: 1. No acute cardiopulmonary process. Electronically signed by: Pinkie Pebbles MD 03/23/2024 08:51 PM EDT RP Workstation: [id number]      ECG & Cardiac Imaging    sinus rhythm at 69 with left atrial enlargement. - personally reviewed.  Assessment & Plan    1.  Non-STEMI:  Pt w/o prior cardiac hx presented 10/25 w/ a 2 wk h/o exertional neck, shoulder, chest, and arm pain.  She had symptoms @ rest on day of admission, assoc w/ nausea.  Here, ECG w/o acute ST/T changes.  CTA chest neg for PE but notable for cor Ca2+ and Ao atherosclerosis.  HsTrop 1512  1928.  Currently c/p free, but has had intermittent mild pressure.  Cont asa, ? blocker, heparin.  I've added high potency statin.  BP soft this AM, thus hold off on nitrates.  The patient understands that risks include but are not limited to stroke (1 in 1000), death (1 in 1000), kidney failure [usually temporary] (1 in 500), bleeding (1 in 200), allergic reaction [possibly serious] (1 in 200), and agrees to proceed.  Will schedule for tomorrow or sooner if recurrent/recalcitrant pain.  2.  Elevated blood pressure without history of  hypertension:  BP high on admission.  Soft this AM.  Follow on low dose ? blocker.  3.  Lipids: Normal LFTs.  Follow-up lipids.  Adding hypodensity statin therapy in the setting of #1 along with coronary calcium and aortic atherosclerosis.  4.  Morbid obesity:  Will benefit from cardiac rehab.  5.  Tob Abuse:  smoking up to 2ppd since age 49.  Cessation advised.  Risk Assessment/Risk Scores:  TIMI Risk Score for Unstable Angina or Non-ST Elevation MI:   The patient's TIMI risk score is 3, which indicates a 13% risk of all cause mortality, new or recurrent myocardial infarction or need for urgent revascularization in the next 14 days.      Signed, Lonni Meager, NP 03/24/2024, 8:35 AM  For questions or updates, please contact   Please consult [email or web address] for contact info under Cardiology/STEMI.

## 2024-03-24 NOTE — Plan of Care (Signed)
  Problem: Clinical Measurements: Goal: Ability to maintain clinical measurements within normal limits will improve Outcome: Progressing   Problem: Clinical Measurements: Goal: Cardiovascular complication will be avoided Outcome: Progressing   Problem: Pain Managment: Goal: General experience of comfort will improve and/or be controlled Outcome: Progressing   Problem: Safety: Goal: Ability to remain free from injury will improve Outcome: Progressing

## 2024-03-24 NOTE — Progress Notes (Signed)
 PROGRESS NOTE    Marisa Malone  FMW:[id number] DOB: [birth year] DOA: 03/23/2024 PCP: Pcp, No   Brief Narrative:   53 y.o. female with medical history significant for tobacco use disorder being admitted with Angina and found to have NSTEMI.   10/26: cardio c/s, plan for cath on 10/27   Assessment & Plan:   Principal Problem:   NSTEMI (non-ST elevated myocardial infarction) (HCC) Active Problems:   Tobacco use disorder   Obesity (BMI 30-39.9)   * NSTEMI (non-ST elevated myocardial infarction) (HCC) Chest pain with typical CTS and troponin over 1500.  EKG nonacute  Risk factors of nicotine dependence, BMI 38 Received chewable aspirin in the ED Continue heparin infusion Aspirin, metoprolol and statin Nitroglycerin sublingual as needed chest pain with morphine for breakthrough Cardiology planning cath tomorrow   Tobacco use disorder Nicotine patch and tobacco cessation counseling   Obesity (BMI 30-39.9) Dietary and lifestyle modification   Tobacco Abuse Nicotine Patch       DVT prophylaxis: Heparin drip      Code Status: (Full code) Family Communication: none at bedside Disposition Plan: possible DC in 2-3 days depending on clinical condition   Consultants:  Cardio  Procedures:  Cath on 10/27     Subjective:  Intermittent chest pain +  Objective: Vitals:   03/24/24 0602 03/24/24 0734 03/24/24 1224 03/24/24 1500  BP: 95/66 100/61 (!) 95/59 107/69  Pulse: 69 60 (!) 59 64  Resp: 20     Temp: 98 F (36.7 C) 98.4 F (36.9 C) 98.4 F (36.9 C) 98.2 F (36.8 C)  TempSrc:  Oral Oral Oral  SpO2: 91% 100% 98% 94%  Weight:      Height:        Intake/Output Summary (Last 24 hours) at 03/24/2024 1813 Last data filed at 03/24/2024 1500 Gross per 24 hour  Intake 258.9 ml  Output 200 ml  Net 58.9 ml   Filed Weights   03/23/24 1648  Weight: 102.1 kg    Examination:  General exam: Appears calm and comfortable  Respiratory system: Clear to  auscultation. Respiratory effort normal. Cardiovascular system: S1 & S2 heard, RRR. No JVD, murmurs, rubs, gallops or clicks. No pedal edema. Gastrointestinal system: Abdomen is soft benign Central nervous system: Alert and oriented. No focal neurological deficits. Extremities: Symmetric 5 x 5 power. Skin: No rashes, lesions or ulcers Psychiatry: Judgement and insight appear normal. Mood & affect appropriate.     Data Reviewed: I have personally reviewed following labs and imaging studies  CBC: Recent Labs  Lab 03/23/24 1950 03/24/24 0502  WBC 7.1 8.0  NEUTROABS 4.4  --   HGB 15.3* 14.2  HCT 45.3 42.2  MCV 93.8 93.8  PLT 266 247   Basic Metabolic Panel: Recent Labs  Lab 03/23/24 1950  NA 138  K 3.9  CL 106  CO2 23  GLUCOSE 99  BUN 23*  CREATININE 0.65  CALCIUM 9.3   GFR: Estimated Creatinine Clearance: 94.6 mL/min (by C-G formula based on SCr of 0.65 mg/dL). Liver Function Tests: Recent Labs  Lab 03/23/24 1950  AST 35  ALT 25  ALKPHOS 47  BILITOT 0.6  PROT 7.4  ALBUMIN 4.1   No results for input(s): LIPASE, AMYLASE in the last 168 hours. No results for input(s): AMMONIA in the last 168 hours. Coagulation Profile: Recent Labs  Lab 03/23/24 2109  INR 0.9      Radiology Studies: ECHOCARDIOGRAM COMPLETE Result Date: 03/24/2024    ECHOCARDIOGRAM REPORT   Patient  Name:   Marisa MARYLYNN NED Date of Exam: 03/24/2024 Medical Rec #:  [id number]         Height:       64.0 in Accession #:    [id number]        Weight:       225.0 lb Date of Birth:  08-29-1970         BSA:          2.057 m Patient Age:    53 years          BP:           95/59 mmHg Patient Gender: F                 HR:           62 bpm. Exam Location:  [facility] Procedure: 2D Echo, Cardiac Doppler, Color Doppler and Strain Analysis (Both            Spectral and Color Flow Doppler were utilized during procedure). Indications:     Chest Pain R07.9  History:         Patient has no prior history of  Echocardiogram examinations.  Sonographer:     Thedora Louder RDCS, FASE Referring Phys:  [id number] LONNI INGLE BERGE Diagnosing Phys: Evalene Lunger MD  Sonographer Comments: Global longitudinal strain was attempted. IMPRESSIONS  1. Left ventricular ejection fraction, by estimation, is 60 to 65%. The left ventricle has normal function. The left ventricle has no regional wall motion abnormalities. Left ventricular diastolic parameters were normal.  2. Right ventricular systolic function is normal. The right ventricular size is normal. There is normal pulmonary artery systolic pressure. The estimated right ventricular systolic pressure is 23.1 mmHg.  3. The mitral valve is normal in structure. No evidence of mitral valve regurgitation. No evidence of mitral stenosis.  4. The aortic valve is normal in structure. Aortic valve regurgitation is not visualized. No aortic stenosis is present.  5. The inferior vena cava is normal in size with greater than 50% respiratory variability, suggesting right atrial pressure of 3 mmHg. FINDINGS  Left Ventricle: Left ventricular ejection fraction, by estimation, is 60 to 65%. The left ventricle has normal function. The left ventricle has no regional wall motion abnormalities. Strain was performed and the global longitudinal strain is indeterminate. The left ventricular internal cavity size was normal in size. There is no left ventricular hypertrophy. Left ventricular diastolic parameters were normal. Right Ventricle: The right ventricular size is normal. No increase in right ventricular wall thickness. Right ventricular systolic function is normal. There is normal pulmonary artery systolic pressure. The tricuspid regurgitant velocity is 2.13 m/s, and  with an assumed right atrial pressure of 5 mmHg, the estimated right ventricular systolic pressure is 23.1 mmHg. Left Atrium: Left atrial size was normal in size. Right Atrium: Right atrial size was normal in size. Pericardium:  There is no evidence of pericardial effusion. Mitral Valve: The mitral valve is normal in structure. No evidence of mitral valve regurgitation. No evidence of mitral valve stenosis. Tricuspid Valve: The tricuspid valve is normal in structure. Tricuspid valve regurgitation is not demonstrated. No evidence of tricuspid stenosis. Aortic Valve: The aortic valve is normal in structure. Aortic valve regurgitation is not visualized. No aortic stenosis is present. Aortic valve peak gradient measures 7.4 mmHg. Pulmonic Valve: The pulmonic valve was normal in structure. Pulmonic valve regurgitation is not visualized. No evidence of pulmonic stenosis. Aorta: The aortic root is normal in size and structure.  Venous: The inferior vena cava is normal in size with greater than 50% respiratory variability, suggesting right atrial pressure of 3 mmHg. IAS/Shunts: No atrial level shunt detected by color flow Doppler. Additional Comments: 3D was performed not requiring image post processing on an independent workstation and was indeterminate.  LEFT VENTRICLE PLAX 2D LVIDd:         4.00 cm   Diastology LVIDs:         2.80 cm   LV e' medial:    7.51 cm/s LV PW:         0.90 cm   LV E/e' medial:  10.6 LV IVS:        1.10 cm   LV e' lateral:   9.46 cm/s LVOT diam:     1.80 cm   LV E/e' lateral: 8.4 LV SV:         54 LV SV Index:   26 LVOT Area:     2.54 cm  RIGHT VENTRICLE RV Basal diam:  3.70 cm TAPSE (M-mode): 1.6 cm LEFT ATRIUM             Index        RIGHT ATRIUM           Index LA diam:        3.30 cm 1.60 cm/m   RA Area:     11.90 cm LA Vol (A2C):   38.1 ml 18.52 ml/m  RA Volume:   30.60 ml  14.88 ml/m LA Vol (A4C):   25.0 ml 12.15 ml/m LA Biplane Vol: 32.3 ml 15.70 ml/m  AORTIC VALVE                 PULMONIC VALVE AV Area (Vmax): 1.85 cm     PV Vmax:        0.93 m/s AV Vmax:        136.00 cm/s  PV Peak grad:   3.5 mmHg AV Peak Grad:   7.4 mmHg     RVOT Peak grad: 2 mmHg LVOT Vmax:      99.00 cm/s LVOT Vmean:     65.100 cm/s  LVOT VTI:       0.212 m  AORTA Ao Root diam: 2.90 cm MITRAL VALVE               TRICUSPID VALVE MV Area (PHT): 3.07 cm    TR Peak grad:   18.1 mmHg MV Decel Time: 247 msec    TR Vmax:        213.00 cm/s MV E velocity: 79.70 cm/s MV A velocity: 74.90 cm/s  SHUNTS MV E/A ratio:  1.06        Systemic VTI:  0.21 m                            Systemic Diam: 1.80 cm Evalene Lunger MD Electronically signed by Evalene Lunger MD Signature Date/Time: 03/24/2024/3:40:46 PM    Final    CT Angio Chest Pulmonary Embolism (PE) W or WO Contrast Result Date: 03/23/2024 EXAM: Cta Of The Chest With Contrast For Pe 03/23/2024 09:40:13 Pm TECHNIQUE: Cta Of The Chest Was Performed After The Administration Of Intravenous Contrast ( Iohexol (Omnipaque) 350 Mg/Ml Injection). Multiplanar Reformatted Images Are Provided For Review. Mip Images Are Provided For Review. Automated Exposure Control, Iterative Reconstruction, And/Or Weight-Based Adjustment Of The Ma/Kv Was Utilized To Reduce The Radiation Dose To As Low As Reasonably Achievable. COMPARISON: None Available.  CLINICAL HISTORY: Evaluate For Pe. Pt To Ed For L Neck Pain And Arm Pain Since About 2 Weeks. Has Full Rom To Arms And Shoulders. Pain Is Sharp. No Lymphadenopathy To Neck Or Face. 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 FINDINGS: PULMONARY ARTERIES: Pulmonary Arteries Are Adequately Opacified For Evaluation. No Pulmonary Embolism. Central Pulmonary Arteries Are Of Normal Caliber. MEDIASTINUM: The Heart And Pericardium Demonstrate No Acute Abnormality. Global Cardiac Size Within Normal Limits. No Pericardial Effusion. Mild Coronary Artery Calcification. LYMPH NODES: No Mention Of Lymphadenopathy. LUNGS AND PLEURA: Attenuation Of The Pulmonary Parenchyma Is Present In Keeping With Multifocal Air Trapping, Likely Related To Small Airway Disease. Mild Bronchial Wall Thickening Noted In Keeping With Airway Inflammation.  The Lungs Are Not Without Acute Process. UPPER ABDOMEN: Limited Images Of The Upper Abdomen Are Unremarkable. SOFT TISSUES AND BONES: Mild-To-Moderate Degenerative Changes Seen Throughout The Thoracic Spine. No Acute Bone Or Soft Tissue Abnormality. Raf Score: * Aortic Atherosclerosis (Icd10-I70.0) IMPRESSION: 1. No pulmonary embolism. 2. Multifocal air trapping and mild bronchial wall thickening, likely related to small airway disease and airway inflammation. 3. Mild coronary artery calcification. 4. Mild-to-moderate degenerative changes throughout the thoracic spine. 5. raf score: Aortic atherosclerosis (ICD10-I70.0) Electronically signed by: Dorethia Molt MD 03/23/2024 10:02 PM EDT RP Workstation: [id number]   DG Chest 2 View Result Date: 03/23/2024 EXAM: 2 VIEW(S) XRAY OF THE CHEST 03/23/2024 08:42:29 PM COMPARISON: None available. CLINICAL HISTORY: Pain. PER ER NOTE; Pt to ED for L neck pain and arm pain since about 2 weeks. Has full ROM to arms and shoulders. Pain is sharp. No lymphadenopathy to neck or face. 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 ; PATIENT STATES  SMOKER X 20 PLUS YEARS, PACK-PACK 1/2 DAILY  FINDINGS: LUNGS AND PLEURA: No focal pulmonary opacity. No pulmonary edema. No pleural effusion. No pneumothorax. HEART AND MEDIASTINUM: No acute abnormality of the cardiac and mediastinal silhouettes. BONES AND SOFT TISSUES: No acute osseous abnormality. IMPRESSION: 1. No acute cardiopulmonary process. Electronically signed by: Pinkie Pebbles MD 03/23/2024 08:51 PM EDT RP Workstation: [id number]        Scheduled Meds:  aspirin EC  81 mg Oral Daily   atorvastatin  80 mg Oral Daily   calcium carbonate  1 tablet Oral TID   metoprolol tartrate  12.5 mg Oral BID   nicotine  21 mg Transdermal Daily   nitroGLYCERIN  0.5 inch Topical Q6H   Continuous Infusions:  heparin 1,300 Units/hr (03/24/24 1351)     LOS: 0 days    Time  spent: 35 mins    Lucie Friedlander Maree, MD [facility] Pager [phone]  If 7PM-7AM, please contact night-coverage [email or web address]  03/24/2024, 6:13 PM

## 2024-03-24 NOTE — Consult Note (Signed)
 Pharmacy Consult Note - Anticoagulation  Pharmacy Consult for heparin Indication: chest pain/ACS  PATIENT MEASUREMENTS: Height: 5' 4 (162.6 cm) Weight: 102.1 kg (225 lb) IBW/kg (Calculated) : 54.7 HEPARIN DW (KG): 78.5  VITAL SIGNS: Temp: 98.2 F (36.8 C) (10/26 1951) Temp Source: Oral (10/26 1500) BP: 118/72 (10/26 1951) Pulse Rate: 65 (10/26 1951)  Recent Labs    03/23/24 1950 03/23/24 2109 03/23/24 2142 03/24/24 0502 03/24/24 1244 03/24/24 1927  HGB 15.3*  --   --  14.2  --   --   HCT 45.3  --   --  42.2  --   --   PLT 266  --   --  247  --   --   APTT  --  30  --   --   --   --   LABPROT  --  13.0  --   --   --   --   INR  --  0.9  --   --   --   --   HEPARINUNFRC  --   --   --  0.14*   < > 0.31  CREATININE 0.65  --   --   --   --   --   TROPONINIHS 1,512*  --  1,928*  --   --   --    < > = values in this interval not displayed.    Estimated Creatinine Clearance: 94.6 mL/min (by C-G formula based on SCr of 0.65 mg/dL).  PAST MEDICAL HISTORY: Past Medical History:  Diagnosis Date   Morbid obesity (HCC)    Tobacco abuse     ASSESSMENT: 53 y.o. female with PMH including obesity, tobacco use is presenting with left-sided neck and arm pain. hsTn elevated (1512 > 1928). Patient is not on chronic anticoagulation per chart review. Pharmacy has been consulted to initiate and manage heparin intravenous infusion.  Baseline labs 10/25: H&H 15/45, INR 0.9, aPTT 30  Pertinent medications: -Aspirin 324 mg PO x 1 10/25 @ 2141 -Aspirin 81mg  daily  Goal(s) of therapy: Heparin level 0.3 - 0.7 units/mL Monitor platelets by anticoagulation protocol: Yes   Baseline anticoagulation labs: Recent Labs    03/23/24 1950 03/23/24 2109 03/24/24 0502  APTT  --  30  --   INR  --  0.9  --   HGB 15.3*  --  14.2  PLT 266  --  247    Date Time aPTT/HL Rate/Comment 10/26   0502      0.14              SUBtherapeutic @ 1000 units/hr   10/26 1244      0.40               Therapeutic x 1 @ 1300 units/hr 10/26 1927 0.31  Therapeutic x 2 @ 1300  PLAN: Continue heparin infusion at 1300 units/hr Check heparin level with AM labs tomorrow Monitor CBC daily while on heparin infusion  Will M. Lenon, PharmD, BCPS Clinical Pharmacist 03/24/2024 7:59 PM

## 2024-03-24 NOTE — Consult Note (Signed)
 Pharmacy Consult Note - Anticoagulation  Pharmacy Consult for heparin Indication: chest pain/ACS  PATIENT MEASUREMENTS: Height: 5' 4 (162.6 cm) Weight: 102.1 kg (225 lb) IBW/kg (Calculated) : 54.7 HEPARIN DW (KG): 78.5  VITAL SIGNS: Temp: 98 F (36.7 C) (10/26 0602) Temp Source: Oral (10/25 2309) BP: 95/66 (10/26 0602) Pulse Rate: 69 (10/26 0602)  Recent Labs    03/23/24 1950 03/23/24 2109 03/23/24 2142 03/24/24 0502  HGB 15.3*  --   --  14.2  HCT 45.3  --   --  42.2  PLT 266  --   --  247  APTT  --  30  --   --   LABPROT  --  13.0  --   --   INR  --  0.9  --   --   HEPARINUNFRC  --   --   --  0.14*  CREATININE 0.65  --   --   --   TROPONINIHS 1,512*  --  1,928*  --     Estimated Creatinine Clearance: 94.6 mL/min (by C-G formula based on SCr of 0.65 mg/dL).  PAST MEDICAL HISTORY: History reviewed. No pertinent past medical history.  ASSESSMENT: 53 y.o. female with PMH including obesity, tobacco use is presenting with left-sided neck and arm pain. hsTn elevated. Patient is not on chronic anticoagulation per chart review. Pharmacy has been consulted to initiate and manage heparin intravenous infusion.  Pertinent medications: Aspirin 325mg  PO x1  Goal(s) of therapy: Heparin level 0.3 - 0.7 units/mL Monitor platelets by anticoagulation protocol: Yes   Baseline anticoagulation labs: Recent Labs    03/23/24 1950 03/23/24 2109 03/24/24 0502  APTT  --  30  --   INR  --  0.9  --   HGB 15.3*  --  14.2  PLT 266  --  247   Baseline aPTT and INR have also been ordered.  Date Time aPTT/HL Rate/Comment 10/26   0502      0.14              SUBtherapeutic @ 1000 u/hr     PLAN: 10/26:  HL @ 0502 = 0.14, SUBtherapeutic  - will order heparin 2350 units IV X 1 bolus and increase drip rate to 1300 units/hr - recheck HL 6 hrs after rate change  Monitor CBC daily while on heparin infusion.  Marisa Malone D Clinical Pharmacist 03/24/2024 6:06 AM

## 2024-03-24 NOTE — Plan of Care (Signed)

## 2024-03-24 NOTE — Progress Notes (Signed)
  Echocardiogram 2D Echocardiogram has been performed.  Marisa Malone 03/24/2024, 3:21 PM

## 2024-03-24 NOTE — Consult Note (Signed)
 Pharmacy Consult Note - Anticoagulation  Pharmacy Consult for heparin Indication: chest pain/ACS  PATIENT MEASUREMENTS: Height: 5' 4 (162.6 cm) Weight: 102.1 kg (225 lb) IBW/kg (Calculated) : 54.7 HEPARIN DW (KG): 78.5  VITAL SIGNS: Temp: 98.4 F (36.9 C) (10/26 0734) Temp Source: Oral (10/26 0734) BP: 100/61 (10/26 0734) Pulse Rate: 60 (10/26 0734)  Recent Labs    03/23/24 1950 03/23/24 2109 03/23/24 2142 03/24/24 0502  HGB 15.3*  --   --  14.2  HCT 45.3  --   --  42.2  PLT 266  --   --  247  APTT  --  30  --   --   LABPROT  --  13.0  --   --   INR  --  0.9  --   --   HEPARINUNFRC  --   --   --  0.14*  CREATININE 0.65  --   --   --   TROPONINIHS 1,512*  --  1,928*  --     Estimated Creatinine Clearance: 94.6 mL/min (by C-G formula based on SCr of 0.65 mg/dL).  PAST MEDICAL HISTORY: Past Medical History:  Diagnosis Date   Morbid obesity (HCC)    Tobacco abuse     ASSESSMENT: 53 y.o. female with PMH including obesity, tobacco use is presenting with left-sided neck and arm pain. hsTn elevated (1512 > 1928). Patient is not on chronic anticoagulation per chart review. Pharmacy has been consulted to initiate and manage heparin intravenous infusion.  Baseline labs 10/25: H&H 15/45, INR 0.9, aPTT 30  Pertinent medications: -Aspirin 324 mg PO x 1 10/25 @ 2141 -Aspirin 81mg  daily  Goal(s) of therapy: Heparin level 0.3 - 0.7 units/mL Monitor platelets by anticoagulation protocol: Yes   Baseline anticoagulation labs: Recent Labs    03/23/24 1950 03/23/24 2109 03/24/24 0502  APTT  --  30  --   INR  --  0.9  --   HGB 15.3*  --  14.2  PLT 266  --  247    Date Time aPTT/HL Rate/Comment 10/26   0502      0.14              SUBtherapeutic @ 1000 units/hr   10/26 1244      0.40              Therapeutic x 1 @ 1300 units/hr  PLAN: Continue heparin infusion at 1300 units/hr Check heparin level in 6 hours (@1900 ) to confirm, once therapeutic x 2, then  daily Monitor CBC daily while on heparin infusion  Mardy LOISE Lime, Student PharmD Clinical Pharmacy Intern 03/24/2024 9:57 AM

## 2024-03-25 ENCOUNTER — Other Ambulatory Visit (HOSPITAL_COMMUNITY): Payer: Self-pay

## 2024-03-25 ENCOUNTER — Encounter: Payer: Self-pay | Admitting: Cardiovascular Disease

## 2024-03-25 ENCOUNTER — Telehealth (HOSPITAL_COMMUNITY): Payer: Self-pay

## 2024-03-25 ENCOUNTER — Encounter: Admission: EM | Disposition: A | Payer: Self-pay | Source: Home / Self Care | Attending: Internal Medicine

## 2024-03-25 DIAGNOSIS — E785 Hyperlipidemia, unspecified: Secondary | ICD-10-CM | POA: Diagnosis not present

## 2024-03-25 DIAGNOSIS — F17200 Nicotine dependence, unspecified, uncomplicated: Secondary | ICD-10-CM | POA: Diagnosis not present

## 2024-03-25 DIAGNOSIS — Z720 Tobacco use: Secondary | ICD-10-CM | POA: Diagnosis not present

## 2024-03-25 DIAGNOSIS — I2510 Atherosclerotic heart disease of native coronary artery without angina pectoris: Secondary | ICD-10-CM | POA: Diagnosis not present

## 2024-03-25 DIAGNOSIS — I2583 Coronary atherosclerosis due to lipid rich plaque: Secondary | ICD-10-CM

## 2024-03-25 DIAGNOSIS — I214 Non-ST elevation (NSTEMI) myocardial infarction: Secondary | ICD-10-CM | POA: Diagnosis not present

## 2024-03-25 DIAGNOSIS — E669 Obesity, unspecified: Secondary | ICD-10-CM | POA: Diagnosis not present

## 2024-03-25 HISTORY — PX: LEFT HEART CATH AND CORONARY ANGIOGRAPHY: CATH118249

## 2024-03-25 HISTORY — PX: CORONARY STENT INTERVENTION: CATH118234

## 2024-03-25 LAB — LIPID PANEL
Cholesterol: 169 mg/dL (ref 0–200)
HDL: 29 mg/dL — ABNORMAL LOW (ref >40)
LDL Cholesterol: 81 mg/dL (ref 0–99)
Total CHOL/HDL Ratio: 5.8 ratio
Triglycerides: 297 mg/dL — ABNORMAL HIGH (ref <150)
VLDL: 59 mg/dL — ABNORMAL HIGH (ref 0–40)

## 2024-03-25 LAB — CBC
HCT: 39.1 % (ref 36.0–46.0)
Hemoglobin: 12.8 g/dL (ref 12.0–15.0)
MCH: 31.3 pg (ref 26.0–34.0)
MCHC: 32.7 g/dL (ref 30.0–36.0)
MCV: 95.6 fL (ref 80.0–100.0)
Platelets: 196 K/uL (ref 150–400)
RBC: 4.09 MIL/uL (ref 3.87–5.11)
RDW: 12.5 % (ref 11.5–15.5)
WBC: 8.3 K/uL (ref 4.0–10.5)
nRBC: 0 % (ref 0.0–0.2)

## 2024-03-25 LAB — BASIC METABOLIC PANEL WITH GFR
Anion gap: 9 (ref 5–15)
BUN: 11 mg/dL (ref 6–20)
CO2: 24 mmol/L (ref 22–32)
Calcium: 8.6 mg/dL — ABNORMAL LOW (ref 8.9–10.3)
Chloride: 105 mmol/L (ref 98–111)
Creatinine, Ser: 0.63 mg/dL (ref 0.44–1.00)
GFR, Estimated: >60 mL/min (ref >60)
Glucose, Bld: 103 mg/dL — ABNORMAL HIGH (ref 70–99)
Potassium: 3.9 mmol/L (ref 3.5–5.1)
Sodium: 138 mmol/L (ref 135–145)

## 2024-03-25 LAB — HEPARIN LEVEL (UNFRACTIONATED): Heparin Unfractionated: 0.26 [IU]/mL — ABNORMAL LOW (ref 0.30–0.70)

## 2024-03-25 LAB — POCT ACTIVATED CLOTTING TIME
Activated Clotting Time: 348 s
Activated Clotting Time: 360 s

## 2024-03-25 SURGERY — LEFT HEART CATH AND CORONARY ANGIOGRAPHY
Anesthesia: Moderate Sedation

## 2024-03-25 MED ORDER — HEPARIN (PORCINE) IN NACL 1000-0.9 UT/500ML-% IV SOLN
INTRAVENOUS | Status: DC | PRN
  Administered 2024-03-25 (×2): 500 mL

## 2024-03-25 MED ORDER — PRASUGREL HCL 10 MG PO TABS
ORAL_TABLET | ORAL | Status: DC | PRN
  Administered 2024-03-25: 60 mg via ORAL

## 2024-03-25 MED ORDER — SODIUM CHLORIDE 0.9% FLUSH
3.0000 mL | Freq: Two times a day (BID) | INTRAVENOUS | Status: DC
Start: 2024-03-25 — End: 2024-03-26
  Administered 2024-03-25 – 2024-03-26 (×3): 3 mL via INTRAVENOUS

## 2024-03-25 MED ORDER — SODIUM CHLORIDE 0.9% FLUSH: 3.0000 mL | INTRAVENOUS | Status: DC | PRN

## 2024-03-25 MED ORDER — VERAPAMIL HCL 2.5 MG/ML IV SOLN
INTRAVENOUS | Status: DC | PRN
  Administered 2024-03-25: 2.5 mg via INTRA_ARTERIAL

## 2024-03-25 MED ORDER — PRASUGREL HCL 10 MG PO TABS
ORAL_TABLET | ORAL | Status: AC
  Filled 2024-03-25: qty 6

## 2024-03-25 MED ORDER — HEPARIN SODIUM (PORCINE) 1000 UNIT/ML IJ SOLN
INTRAMUSCULAR | Status: AC
  Filled 2024-03-25: qty 10

## 2024-03-25 MED ORDER — MIDAZOLAM HCL 2 MG/2ML IJ SOLN
INTRAMUSCULAR | Status: AC
  Filled 2024-03-25: qty 2

## 2024-03-25 MED ORDER — HEPARIN SODIUM (PORCINE) 1000 UNIT/ML IJ SOLN
INTRAMUSCULAR | Status: DC | PRN
  Administered 2024-03-25 (×2): 5000 [IU] via INTRAVENOUS

## 2024-03-25 MED ORDER — LIDOCAINE HCL (PF) 1 % IJ SOLN
INTRAMUSCULAR | Status: DC | PRN
  Administered 2024-03-25: 2 mL

## 2024-03-25 MED ORDER — MIDAZOLAM HCL (PF) 2 MG/2ML IJ SOLN
INTRAMUSCULAR | Status: DC | PRN
  Administered 2024-03-25 (×2): 1 mg via INTRAVENOUS

## 2024-03-25 MED ORDER — FENTANYL CITRATE (PF) 100 MCG/2ML IJ SOLN
INTRAMUSCULAR | Status: AC
  Filled 2024-03-25: qty 2

## 2024-03-25 MED ORDER — HEPARIN (PORCINE) IN NACL 1000-0.9 UT/500ML-% IV SOLN
INTRAVENOUS | Status: AC
  Filled 2024-03-25: qty 1000

## 2024-03-25 MED ORDER — NITROGLYCERIN 1 MG/10 ML FOR IR/CATH LAB
INTRA_ARTERIAL | Status: DC | PRN
  Administered 2024-03-25 (×2): 200 ug via INTRACORONARY

## 2024-03-25 MED ORDER — SODIUM CHLORIDE 0.9 % IV SOLN: 250.0000 mL | INTRAVENOUS | Status: AC | PRN

## 2024-03-25 MED ORDER — FREE WATER
500.0000 mL | Freq: Once | Status: AC
  Administered 2024-03-25: 500 mL via ORAL

## 2024-03-25 MED ORDER — VERAPAMIL HCL 2.5 MG/ML IV SOLN
INTRAVENOUS | Status: AC
  Filled 2024-03-25: qty 2

## 2024-03-25 MED ORDER — PRASUGREL HCL 10 MG PO TABS
10.0000 mg | ORAL_TABLET | Freq: Every day | ORAL | Status: DC
  Administered 2024-03-26: 10 mg via ORAL
  Filled 2024-03-25: qty 1

## 2024-03-25 MED ORDER — LIDOCAINE HCL 1 % IJ SOLN
INTRAMUSCULAR | Status: AC
  Filled 2024-03-25: qty 20

## 2024-03-25 MED ORDER — IOHEXOL 300 MG/ML  SOLN
INTRAMUSCULAR | Status: DC | PRN
  Administered 2024-03-25: 110 mL

## 2024-03-25 MED ORDER — FENTANYL CITRATE (PF) 100 MCG/2ML IJ SOLN
INTRAMUSCULAR | Status: DC | PRN
  Administered 2024-03-25 (×2): 25 ug via INTRAVENOUS

## 2024-03-25 MED ORDER — HEPARIN BOLUS VIA INFUSION
1200.0000 [IU] | Freq: Once | INTRAVENOUS | Status: DC
  Filled 2024-03-25: qty 1200

## 2024-03-25 SURGICAL SUPPLY — 16 items
BALLOON TREK RX 2.5X12 (BALLOONS) IMPLANT
BALLOON ~~LOC~~ TREK NEO RX 3.5X15 (BALLOONS) IMPLANT
CATH INFINITI AMBI 5FR JK (CATHETERS) IMPLANT
CATH LAUNCHER 6FR JR4 (CATHETERS) IMPLANT
DEVICE RAD TR BAND REGULAR (VASCULAR PRODUCTS) IMPLANT
DRAPE BRACHIAL (DRAPES) IMPLANT
GLIDESHEATH SLEND SS 6F .021 (SHEATH) IMPLANT
GUIDEWIRE INQWIRE 1.5J.035X260 (WIRE) IMPLANT
KIT ENCORE 26 ADVANTAGE (KITS) IMPLANT
KIT SYRINGE INJ CVI SPIKEX1 (MISCELLANEOUS) IMPLANT
PACK CARDIAC CATH (CUSTOM PROCEDURE TRAY) ×2 IMPLANT
SET ATX-X65L (MISCELLANEOUS) IMPLANT
STATION PROTECTION PRESSURIZED (MISCELLANEOUS) IMPLANT
STENT ONYX FRONTIER 3.0X26 (Permanent Stent) IMPLANT
TUBING CIL FLEX 10 FLL-RA (TUBING) IMPLANT
WIRE RUNTHROUGH .014X180CM (WIRE) IMPLANT

## 2024-03-25 NOTE — Interval H&P Note (Signed)
 History and Physical Interval Note:  03/25/2024 7:39 AM  Marisa Malone  has presented today for surgery, with the diagnosis of nstemi.  The various methods of treatment have been discussed with the patient and family. After consideration of risks, benefits and other options for treatment, the patient has consented to  Procedure(s): LEFT HEART CATH AND CORONARY ANGIOGRAPHY (N/A) as a surgical intervention.  The patient's history has been reviewed, patient examined, no change in status, stable for surgery.  I have reviewed the patient's chart and labs.  Questions were answered to the patient's satisfaction.     [name]

## 2024-03-25 NOTE — Progress Notes (Signed)
 Rounding Note   Patient Name: Marisa Malone Date of Encounter: 03/25/2024  [facility] Cardiologist: Evalene Lunger, MD   Subjective She underwent left heart catheterization this morning which showed occluded right coronary artery with left-to-right collaterals.  I decided to proceed with PCI given postinfarct angina.  This was successful with placement of 1 drug-eluting stent placement.  She had a headache postprocedure thought to be due to intracoronary nitroglycerin.  Scheduled Meds:  [MAR Hold] aspirin EC  81 mg Oral Daily   [MAR Hold] atorvastatin  80 mg Oral Daily   [MAR Hold] calcium carbonate  1 tablet Oral TID   [MAR Hold] metoprolol tartrate  12.5 mg Oral BID   [MAR Hold] nicotine  21 mg Transdermal Daily   [START ON 03/26/2024] prasugrel  10 mg Oral Daily   sodium chloride flush  3 mL Intravenous Q12H   Continuous Infusions:  sodium chloride     PRN Meds: sodium chloride, [MAR Hold] acetaminophen, [MAR Hold]  morphine injection, [MAR Hold] nitroGLYCERIN, [MAR Hold] ondansetron (ZOFRAN) IV, prasugrel, sodium chloride flush, [MAR Hold] zolpidem   Vital Signs  Vitals:   03/25/24 0314 03/25/24 0709 03/25/24 0739 03/25/24 0845  BP: 92/63 (!) 102/58    Pulse: 65 70    Resp: 18 17    Temp: 98.3 F (36.8 C) 98.9 F (37.2 C)  98.1 F (36.7 C)  TempSrc: Oral Oral  Oral  SpO2: 95% 92% 95%   Weight:      Height:        Intake/Output Summary (Last 24 hours) at 03/25/2024 0903 Last data filed at 03/25/2024 0844 Gross per 24 hour  Intake 1442.82 ml  Output --  Net 1442.82 ml      03/23/2024    4:48 PM  Last 3 Weights  Weight (lbs) 225 lb  Weight (kg) 102.059 kg      Telemetry Sinus rhythm- Personally Reviewed  ECG   - Personally Reviewed  Physical Exam  GEN: No acute distress.   Neck: No JVD Cardiac: RRR, no murmurs, rubs, or gallops.  Respiratory: Clear to auscultation bilaterally. GI: Soft, nontender, non-distended  MS: No edema; No  deformity. Neuro:  Nonfocal  Psych: Normal affect   Labs High Sensitivity Troponin:   Recent Labs  Lab 03/23/24 1950 03/23/24 2142  TROPONINIHS 1,512* 1,928*     Chemistry Recent Labs  Lab 03/23/24 1950 03/25/24 0434  NA 138 138  K 3.9 3.9  CL 106 105  CO2 23 24  GLUCOSE 99 103*  BUN 23* 11  CREATININE 0.65 0.63  CALCIUM 9.3 8.6*  PROT 7.4  --   ALBUMIN 4.1  --   AST 35  --   ALT 25  --   ALKPHOS 47  --   BILITOT 0.6  --   GFRNONAA >60 >60  ANIONGAP 9 9    Lipids  Recent Labs  Lab 03/25/24 0435  CHOL 169  TRIG 297*  HDL 29*  LDLCALC 81  CHOLHDL 5.8    Hematology Recent Labs  Lab 03/23/24 1950 03/24/24 0502 03/25/24 0434  WBC 7.1 8.0 8.3  RBC 4.83 4.50 4.09  HGB 15.3* 14.2 12.8  HCT 45.3 42.2 39.1  MCV 93.8 93.8 95.6  MCH 31.7 31.6 31.3  MCHC 33.8 33.6 32.7  RDW 12.5 12.6 12.5  PLT 266 247 196   Thyroid No results for input(s): TSH, FREET4 in the last 168 hours.  BNP Recent Labs  Lab 03/23/24 2109  BNP 50.0  DDimer No results for input(s): DDIMER in the last 168 hours.   Radiology  CARDIAC CATHETERIZATION Result Date: 03/25/2024   Mid LAD lesion is 30% stenosed.   Prox RCA to Mid RCA lesion is 100% stenosed.   Prox RCA lesion is 20% stenosed.   A drug-eluting stent was successfully placed using a STENT ONYX FRONTIER 3.0X26.   Post intervention, there is a 0% residual stenosis.   In the absence of any other complications or medical issues, we expect the patient to be ready for discharge from an interventional cardiology perspective on 03/26/2024.   Recommend uninterrupted dual antiplatelet therapy with Aspirin 81mg  daily and Prasugrel 10mg  daily for a minimum of 12 months (ACS-Class I recommendation). 1.  Severe one-vessel coronary artery disease with occluded proximal right coronary artery with left-to-right collaterals.  This is the likely culprit for myocardial infarction.  No other obstructive disease. 2.  Left ventricular angiography  was not performed.  EF was normal by echo.  Moderately elevated left ventricular end-diastolic pressure at 22 mmHg. 3.  Successful angioplasty and drug-eluting stent placement to the proximal right coronary artery. Recommendations: Aggressive treatment of risk factors smoking cessation.   ECHOCARDIOGRAM COMPLETE Result Date: 03/24/2024    ECHOCARDIOGRAM REPORT   Patient Name:   Marisa Malone Date of Exam: 03/24/2024 Medical Rec #:  [id number]         Height:       64.0 in Accession #:    [id number]        Weight:       225.0 lb Date of Birth:  Apr 27, 1971         BSA:          2.057 m Patient Age:    53 years          BP:           95/59 mmHg Patient Gender: F                 HR:           62 bpm. Exam Location:  [facility] Procedure: 2D Echo, Cardiac Doppler, Color Doppler and Strain Analysis (Both            Spectral and Color Flow Doppler were utilized during procedure). Indications:     Chest Pain R07.9  History:         Patient has no prior history of Echocardiogram examinations.  Sonographer:     Thedora Louder RDCS, FASE Referring Phys:  [id number] LONNI INGLE BERGE Diagnosing Phys: Evalene Lunger MD  Sonographer Comments: Global longitudinal strain was attempted. IMPRESSIONS  1. Left ventricular ejection fraction, by estimation, is 60 to 65%. The left ventricle has normal function. The left ventricle has no regional wall motion abnormalities. Left ventricular diastolic parameters were normal.  2. Right ventricular systolic function is normal. The right ventricular size is normal. There is normal pulmonary artery systolic pressure. The estimated right ventricular systolic pressure is 23.1 mmHg.  3. The mitral valve is normal in structure. No evidence of mitral valve regurgitation. No evidence of mitral stenosis.  4. The aortic valve is normal in structure. Aortic valve regurgitation is not visualized. No aortic stenosis is present.  5. The inferior vena cava is normal in size with greater than 50%  respiratory variability, suggesting right atrial pressure of 3 mmHg. FINDINGS  Left Ventricle: Left ventricular ejection fraction, by estimation, is 60 to 65%. The left ventricle has normal function. The left ventricle has no regional  wall motion abnormalities. Strain was performed and the global longitudinal strain is indeterminate. The left ventricular internal cavity size was normal in size. There is no left ventricular hypertrophy. Left ventricular diastolic parameters were normal. Right Ventricle: The right ventricular size is normal. No increase in right ventricular wall thickness. Right ventricular systolic function is normal. There is normal pulmonary artery systolic pressure. The tricuspid regurgitant velocity is 2.13 m/s, and  with an assumed right atrial pressure of 5 mmHg, the estimated right ventricular systolic pressure is 23.1 mmHg. Left Atrium: Left atrial size was normal in size. Right Atrium: Right atrial size was normal in size. Pericardium: There is no evidence of pericardial effusion. Mitral Valve: The mitral valve is normal in structure. No evidence of mitral valve regurgitation. No evidence of mitral valve stenosis. Tricuspid Valve: The tricuspid valve is normal in structure. Tricuspid valve regurgitation is not demonstrated. No evidence of tricuspid stenosis. Aortic Valve: The aortic valve is normal in structure. Aortic valve regurgitation is not visualized. No aortic stenosis is present. Aortic valve peak gradient measures 7.4 mmHg. Pulmonic Valve: The pulmonic valve was normal in structure. Pulmonic valve regurgitation is not visualized. No evidence of pulmonic stenosis. Aorta: The aortic root is normal in size and structure. Venous: The inferior vena cava is normal in size with greater than 50% respiratory variability, suggesting right atrial pressure of 3 mmHg. IAS/Shunts: No atrial level shunt detected by color flow Doppler. Additional Comments: 3D was performed not requiring image post  processing on an independent workstation and was indeterminate.  LEFT VENTRICLE PLAX 2D LVIDd:         4.00 cm   Diastology LVIDs:         2.80 cm   LV e' medial:    7.51 cm/s LV PW:         0.90 cm   LV E/e' medial:  10.6 LV IVS:        1.10 cm   LV e' lateral:   9.46 cm/s LVOT diam:     1.80 cm   LV E/e' lateral: 8.4 LV SV:         54 LV SV Index:   26 LVOT Area:     2.54 cm  RIGHT VENTRICLE RV Basal diam:  3.70 cm TAPSE (M-mode): 1.6 cm LEFT ATRIUM             Index        RIGHT ATRIUM           Index LA diam:        3.30 cm 1.60 cm/m   RA Area:     11.90 cm LA Vol (A2C):   38.1 ml 18.52 ml/m  RA Volume:   30.60 ml  14.88 ml/m LA Vol (A4C):   25.0 ml 12.15 ml/m LA Biplane Vol: 32.3 ml 15.70 ml/m  AORTIC VALVE                 PULMONIC VALVE AV Area (Vmax): 1.85 cm     PV Vmax:        0.93 m/s AV Vmax:        136.00 cm/s  PV Peak grad:   3.5 mmHg AV Peak Grad:   7.4 mmHg     RVOT Peak grad: 2 mmHg LVOT Vmax:      99.00 cm/s LVOT Vmean:     65.100 cm/s LVOT VTI:       0.212 m  AORTA Ao Root diam: 2.90 cm MITRAL VALVE  TRICUSPID VALVE MV Area (PHT): 3.07 cm    TR Peak grad:   18.1 mmHg MV Decel Time: 247 msec    TR Vmax:        213.00 cm/s MV E velocity: 79.70 cm/s MV A velocity: 74.90 cm/s  SHUNTS MV E/A ratio:  1.06        Systemic VTI:  0.21 m                            Systemic Diam: 1.80 cm Evalene Lunger MD Electronically signed by Evalene Lunger MD Signature Date/Time: 03/24/2024/3:40:46 PM    Final    CT Angio Chest Pulmonary Embolism (PE) W or WO Contrast Result Date: 03/23/2024 EXAM: Cta Of The Chest With Contrast For Pe 03/23/2024 09:40:13 Pm TECHNIQUE: Cta Of The Chest Was Performed After The Administration Of Intravenous Contrast ( Iohexol (Omnipaque) 350 Mg/Ml Injection). Multiplanar Reformatted Images Are Provided For Review. Mip Images Are Provided For Review. Automated Exposure Control, Iterative Reconstruction, And/Or Weight-Based Adjustment Of The Ma/Kv Was Utilized To  Reduce The Radiation Dose To As Low As Reasonably Achievable. COMPARISON: None Available. CLINICAL HISTORY: Evaluate For Pe. Pt To Ed For L Neck Pain And Arm Pain Since About 2 Weeks. Has Full Rom To Arms And Shoulders. Pain Is Sharp. No Lymphadenopathy To Neck Or Face. 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 FINDINGS: PULMONARY ARTERIES: Pulmonary Arteries Are Adequately Opacified For Evaluation. No Pulmonary Embolism. Central Pulmonary Arteries Are Of Normal Caliber. MEDIASTINUM: The Heart And Pericardium Demonstrate No Acute Abnormality. Global Cardiac Size Within Normal Limits. No Pericardial Effusion. Mild Coronary Artery Calcification. LYMPH NODES: No Mention Of Lymphadenopathy. LUNGS AND PLEURA: Attenuation Of The Pulmonary Parenchyma Is Present In Keeping With Multifocal Air Trapping, Likely Related To Small Airway Disease. Mild Bronchial Wall Thickening Noted In Keeping With Airway Inflammation. The Lungs Are Not Without Acute Process. UPPER ABDOMEN: Limited Images Of The Upper Abdomen Are Unremarkable. SOFT TISSUES AND BONES: Mild-To-Moderate Degenerative Changes Seen Throughout The Thoracic Spine. No Acute Bone Or Soft Tissue Abnormality. Raf Score: * Aortic Atherosclerosis (Icd10-I70.0) IMPRESSION: 1. No pulmonary embolism. 2. Multifocal air trapping and mild bronchial wall thickening, likely related to small airway disease and airway inflammation. 3. Mild coronary artery calcification. 4. Mild-to-moderate degenerative changes throughout the thoracic spine. 5. raf score: Aortic atherosclerosis (ICD10-I70.0) Electronically signed by: Dorethia Molt MD 03/23/2024 10:02 PM EDT RP Workstation: [id number]   DG Chest 2 View Result Date: 03/23/2024 EXAM: 2 VIEW(S) XRAY OF THE CHEST 03/23/2024 08:42:29 PM COMPARISON: None available. CLINICAL HISTORY: Pain. PER ER NOTE; Pt to ED for L neck pain and arm pain since about 2 weeks. Has full ROM to arms and  shoulders. Pain is sharp. No lymphadenopathy to neck or face. 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 ; PATIENT STATES  SMOKER X 20 PLUS YEARS, PACK-PACK 1/2 DAILY  FINDINGS: LUNGS AND PLEURA: No focal pulmonary opacity. No pulmonary edema. No pleural effusion. No pneumothorax. HEART AND MEDIASTINUM: No acute abnormality of the cardiac and mediastinal silhouettes. BONES AND SOFT TISSUES: No acute osseous abnormality. IMPRESSION: 1. No acute cardiopulmonary process. Electronically signed by: Pinkie Pebbles MD 03/23/2024 08:51 PM EDT RP Workstation: [id number]    Cardiac Studies Echocardiogram showed normal LV systolic function  Patient Profile   53 y.o. female with history of  tobacco abuse and obesity, who is being seen today for the evaluation of non-STEMI at the request of Dr. Cleatus.   Assessment & Plan  1.  Non-ST elevation myocardial infarction: Cardiac catheterization showed occluded proximal right coronary artery which was treated successfully with PCI and drug-eluting stent placement.  Otherwise there is mild to moderate left coronary artery disease that does not require revascularization.  EF was normal by echo. Recommend aspirin indefinitely and prasugrel 10 mg daily for 1 year. Recommend aggressive treatment of risk factors.  She was referred to cardiac rehab. She can likely be discharged home tomorrow if she remains stable.  2.  Hyperlipidemia: Continue high-dose atorvastatin.  3.  Tobacco use: I discussed with her the importance of smoking cessation.     For questions or updates, please contact [facility] Please consult [email or web address] for contact info under      Signed, Deatrice Cage, MD  03/25/2024, 9:03 AM

## 2024-03-25 NOTE — Progress Notes (Signed)
 PROGRESS NOTE    Marisa Malone  FMW:[id number] DOB: [birth year] DOA: 03/23/2024 PCP: Pcp, No   Brief Narrative:   53 y.o. female with medical history significant for tobacco use disorder being admitted with Angina and found to have NSTEMI.   10/26: cardio c/s, plan for cath on 10/27 10/27: Cardiac catheterization showed occluded proximal right coronary artery which was treated successfully with PCI and drug-eluting stent placement.   Assessment & Plan:   Principal Problem:   NSTEMI (non-ST elevated myocardial infarction) (HCC) Active Problems:   Tobacco use disorder   Obesity (BMI 30-39.9)   * NSTEMI (non-ST elevated myocardial infarction) (HCC) Chest pain with typical CTS and troponin over 1500.  EKG nonacute  Risk factors of nicotine dependence, BMI 38 Aspirin, metoprolol and statin S/p cardiac cath cardiac catheterization showed point occluded proximal right coronary artery which was treated successfully with PCI and drug-eluting stent placement.  Otherwise there is mild to moderate left coronary artery disease that does not require revascularization.  EF was normal by echo. Recommend aspirin indefinitely and prasugrel 10 mg daily for 1 year. Recommend aggressive treatment of risk factors.  She was referred to cardiac rehab by cardiology.  Tobacco use disorder Nicotine patch and tobacco cessation counseling   Obesity (BMI 30-39.9) Dietary and lifestyle modification   Tobacco Abuse Nicotine Patch       DVT prophylaxis: Aspirin and prasugrel      Code Status: (Full code) Family Communication: none at bedside Disposition Plan: possible DC to or depending on clinical condition and cardiac clearance   Consultants:  Cardio  Procedures:  Cath on 10/27 successful PCI drug-eluting stent placement in RCA     Subjective:  Feeling much better.  Some pleuritic chest pain Objective: Vitals:   03/25/24 1030 03/25/24 1045 03/25/24 1111 03/25/24 1143  BP:  109/83 126/79 129/79 121/82  Pulse: 68 67 73 66  Resp: (!) 22 18  (!) 25  Temp:  98.3 F (36.8 C) 98.1 F (36.7 C) 98.3 F (36.8 C)  TempSrc:  Oral Oral Oral  SpO2: 96% 97% 95% 96%  Weight:      Height:        Intake/Output Summary (Last 24 hours) at 03/25/2024 1513 Last data filed at 03/25/2024 0844 Gross per 24 hour  Intake 1183.92 ml  Output --  Net 1183.92 ml   Filed Weights   03/23/24 1648  Weight: 102.1 kg    Examination:  General exam: Appears calm and comfortable  Respiratory system: Clear to auscultation. Respiratory effort normal. Cardiovascular system: S1 & S2 heard, RRR.  No murmurs, no pedal edema Gastrointestinal system: Abdomen is soft benign Central nervous system: Alert and oriented. No focal neurological deficits. Extremities: Symmetric 5 x 5 power. Skin: No rashes, lesions or ulcers Psychiatry: Judgement and insight appear normal. Mood & affect appropriate.     Data Reviewed: I have personally reviewed following labs and imaging studies  CBC: Recent Labs  Lab 03/23/24 1950 03/24/24 0502 03/25/24 0434  WBC 7.1 8.0 8.3  NEUTROABS 4.4  --   --   HGB 15.3* 14.2 12.8  HCT 45.3 42.2 39.1  MCV 93.8 93.8 95.6  PLT 266 247 196   Basic Metabolic Panel: Recent Labs  Lab 03/23/24 1950 03/25/24 0434  NA 138 138  K 3.9 3.9  CL 106 105  CO2 23 24  GLUCOSE 99 103*  BUN 23* 11  CREATININE 0.65 0.63  CALCIUM 9.3 8.6*   GFR: Estimated Creatinine Clearance: 94.6 mL/min (  by C-G formula based on SCr of 0.63 mg/dL). Liver Function Tests: Recent Labs  Lab 03/23/24 1950  AST 35  ALT 25  ALKPHOS 47  BILITOT 0.6  PROT 7.4  ALBUMIN 4.1   No results for input(s): LIPASE, AMYLASE in the last 168 hours. No results for input(s): AMMONIA in the last 168 hours. Coagulation Profile: Recent Labs  Lab 03/23/24 2109  INR 0.9      Radiology Studies: CARDIAC CATHETERIZATION Result Date: 03/25/2024   Mid LAD lesion is 30% stenosed.   Prox  RCA to Mid RCA lesion is 100% stenosed.   Prox RCA lesion is 20% stenosed.   A drug-eluting stent was successfully placed using a STENT ONYX FRONTIER 3.0X26.   Post intervention, there is a 0% residual stenosis.   In the absence of any other complications or medical issues, we expect the patient to be ready for discharge from an interventional cardiology perspective on 03/26/2024.   Recommend uninterrupted dual antiplatelet therapy with Aspirin 81mg  daily and Prasugrel 10mg  daily for a minimum of 12 months (ACS-Class I recommendation). 1.  Severe one-vessel coronary artery disease with occluded proximal right coronary artery with left-to-right collaterals.  This is the likely culprit for myocardial infarction.  No other obstructive disease. 2.  Left ventricular angiography was not performed.  EF was normal by echo.  Moderately elevated left ventricular end-diastolic pressure at 22 mmHg. 3.  Successful angioplasty and drug-eluting stent placement to the proximal right coronary artery. Recommendations: Aggressive treatment of risk factors smoking cessation.   ECHOCARDIOGRAM COMPLETE Result Date: 03/24/2024    ECHOCARDIOGRAM REPORT   Patient Name:   Marisa Malone Date of Exam: 03/24/2024 Medical Rec #:  [id number]         Height:       64.0 in Accession #:    [id number]        Weight:       225.0 lb Date of Birth:  [birth year]         BSA:          2.057 m Patient Age:    53 years          BP:           95/59 mmHg Patient Gender: F                 HR:           62 bpm. Exam Location:  [facility] Procedure: 2D Echo, Cardiac Doppler, Color Doppler and Strain Analysis (Both            Spectral and Color Flow Doppler were utilized during procedure). Indications:     Chest Pain R07.9  History:         Patient has no prior history of Echocardiogram examinations.  Sonographer:     Thedora Louder RDCS, FASE Referring Phys:  [id number] LONNI INGLE BERGE Diagnosing Phys: Evalene Lunger MD  Sonographer Comments: Global  longitudinal strain was attempted. IMPRESSIONS  1. Left ventricular ejection fraction, by estimation, is 60 to 65%. The left ventricle has normal function. The left ventricle has no regional wall motion abnormalities. Left ventricular diastolic parameters were normal.  2. Right ventricular systolic function is normal. The right ventricular size is normal. There is normal pulmonary artery systolic pressure. The estimated right ventricular systolic pressure is 23.1 mmHg.  3. The mitral valve is normal in structure. No evidence of mitral valve regurgitation. No evidence of mitral stenosis.  4. The aortic valve is normal  in structure. Aortic valve regurgitation is not visualized. No aortic stenosis is present.  5. The inferior vena cava is normal in size with greater than 50% respiratory variability, suggesting right atrial pressure of 3 mmHg. FINDINGS  Left Ventricle: Left ventricular ejection fraction, by estimation, is 60 to 65%. The left ventricle has normal function. The left ventricle has no regional wall motion abnormalities. Strain was performed and the global longitudinal strain is indeterminate. The left ventricular internal cavity size was normal in size. There is no left ventricular hypertrophy. Left ventricular diastolic parameters were normal. Right Ventricle: The right ventricular size is normal. No increase in right ventricular wall thickness. Right ventricular systolic function is normal. There is normal pulmonary artery systolic pressure. The tricuspid regurgitant velocity is 2.13 m/s, and  with an assumed right atrial pressure of 5 mmHg, the estimated right ventricular systolic pressure is 23.1 mmHg. Left Atrium: Left atrial size was normal in size. Right Atrium: Right atrial size was normal in size. Pericardium: There is no evidence of pericardial effusion. Mitral Valve: The mitral valve is normal in structure. No evidence of mitral valve regurgitation. No evidence of mitral valve stenosis. Tricuspid  Valve: The tricuspid valve is normal in structure. Tricuspid valve regurgitation is not demonstrated. No evidence of tricuspid stenosis. Aortic Valve: The aortic valve is normal in structure. Aortic valve regurgitation is not visualized. No aortic stenosis is present. Aortic valve peak gradient measures 7.4 mmHg. Pulmonic Valve: The pulmonic valve was normal in structure. Pulmonic valve regurgitation is not visualized. No evidence of pulmonic stenosis. Aorta: The aortic root is normal in size and structure. Venous: The inferior vena cava is normal in size with greater than 50% respiratory variability, suggesting right atrial pressure of 3 mmHg. IAS/Shunts: No atrial level shunt detected by color flow Doppler. Additional Comments: 3D was performed not requiring image post processing on an independent workstation and was indeterminate.  LEFT VENTRICLE PLAX 2D LVIDd:         4.00 cm   Diastology LVIDs:         2.80 cm   LV e' medial:    7.51 cm/s LV PW:         0.90 cm   LV E/e' medial:  10.6 LV IVS:        1.10 cm   LV e' lateral:   9.46 cm/s LVOT diam:     1.80 cm   LV E/e' lateral: 8.4 LV SV:         54 LV SV Index:   26 LVOT Area:     2.54 cm  RIGHT VENTRICLE RV Basal diam:  3.70 cm TAPSE (M-mode): 1.6 cm LEFT ATRIUM             Index        RIGHT ATRIUM           Index LA diam:        3.30 cm 1.60 cm/m   RA Area:     11.90 cm LA Vol (A2C):   38.1 ml 18.52 ml/m  RA Volume:   30.60 ml  14.88 ml/m LA Vol (A4C):   25.0 ml 12.15 ml/m LA Biplane Vol: 32.3 ml 15.70 ml/m  AORTIC VALVE                 PULMONIC VALVE AV Area (Vmax): 1.85 cm     PV Vmax:        0.93 m/s AV Vmax:  136.00 cm/s  PV Peak grad:   3.5 mmHg AV Peak Grad:   7.4 mmHg     RVOT Peak grad: 2 mmHg LVOT Vmax:      99.00 cm/s LVOT Vmean:     65.100 cm/s LVOT VTI:       0.212 m  AORTA Ao Root diam: 2.90 cm MITRAL VALVE               TRICUSPID VALVE MV Area (PHT): 3.07 cm    TR Peak grad:   18.1 mmHg MV Decel Time: 247 msec    TR Vmax:         213.00 cm/s MV E velocity: 79.70 cm/s MV A velocity: 74.90 cm/s  SHUNTS MV E/A ratio:  1.06        Systemic VTI:  0.21 m                            Systemic Diam: 1.80 cm Evalene Lunger MD Electronically signed by Evalene Lunger MD Signature Date/Time: 03/24/2024/3:40:46 PM    Final    CT Angio Chest Pulmonary Embolism (PE) W or WO Contrast Result Date: 03/23/2024 EXAM: Cta Of The Chest With Contrast For Pe 03/23/2024 09:40:13 Pm TECHNIQUE: Cta Of The Chest Was Performed After The Administration Of Intravenous Contrast ( Iohexol (Omnipaque) 350 Mg/Ml Injection). Multiplanar Reformatted Images Are Provided For Review. Mip Images Are Provided For Review. Automated Exposure Control, Iterative Reconstruction, And/Or Weight-Based Adjustment Of The Ma/Kv Was Utilized To Reduce The Radiation Dose To As Low As Reasonably Achievable. COMPARISON: None Available. CLINICAL HISTORY: Evaluate For Pe. Pt To Ed For L Neck Pain And Arm Pain Since About 2 Weeks. Has Full Rom To Arms And Shoulders. Pain Is Sharp. No Lymphadenopathy To Neck Or Face. 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 FINDINGS: PULMONARY ARTERIES: Pulmonary Arteries Are Adequately Opacified For Evaluation. No Pulmonary Embolism. Central Pulmonary Arteries Are Of Normal Caliber. MEDIASTINUM: The Heart And Pericardium Demonstrate No Acute Abnormality. Global Cardiac Size Within Normal Limits. No Pericardial Effusion. Mild Coronary Artery Calcification. LYMPH NODES: No Mention Of Lymphadenopathy. LUNGS AND PLEURA: Attenuation Of The Pulmonary Parenchyma Is Present In Keeping With Multifocal Air Trapping, Likely Related To Small Airway Disease. Mild Bronchial Wall Thickening Noted In Keeping With Airway Inflammation. The Lungs Are Not Without Acute Process. UPPER ABDOMEN: Limited Images Of The Upper Abdomen Are Unremarkable. SOFT TISSUES AND BONES: Mild-To-Moderate Degenerative Changes Seen Throughout The  Thoracic Spine. No Acute Bone Or Soft Tissue Abnormality. Raf Score: * Aortic Atherosclerosis (Icd10-I70.0) IMPRESSION: 1. No pulmonary embolism. 2. Multifocal air trapping and mild bronchial wall thickening, likely related to small airway disease and airway inflammation. 3. Mild coronary artery calcification. 4. Mild-to-moderate degenerative changes throughout the thoracic spine. 5. raf score: Aortic atherosclerosis (ICD10-I70.0) Electronically signed by: Dorethia Molt MD 03/23/2024 10:02 PM EDT RP Workstation: [id number]   DG Chest 2 View Result Date: 03/23/2024 EXAM: 2 VIEW(S) XRAY OF THE CHEST 03/23/2024 08:42:29 PM COMPARISON: None available. CLINICAL HISTORY: Pain. PER ER NOTE; Pt to ED for L neck pain and arm pain since about 2 weeks. Has full ROM to arms and shoulders. Pain is sharp. No lymphadenopathy to neck or face. 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 ; PATIENT STATES  SMOKER X 20 PLUS YEARS, PACK-PACK 1/2 DAILY  FINDINGS:  LUNGS AND PLEURA: No focal pulmonary opacity. No pulmonary edema. No pleural effusion. No pneumothorax. HEART AND MEDIASTINUM: No acute abnormality of the cardiac and mediastinal silhouettes. BONES AND SOFT TISSUES: No acute osseous abnormality. IMPRESSION: 1. No acute cardiopulmonary process. Electronically signed by: Pinkie Pebbles MD 03/23/2024 08:51 PM EDT RP Workstation: [id number]        Scheduled Meds:  aspirin EC  81 mg Oral Daily   atorvastatin  80 mg Oral Daily   calcium carbonate  1 tablet Oral TID   metoprolol tartrate  12.5 mg Oral BID   nicotine  21 mg Transdermal Daily   [START ON 03/26/2024] prasugrel  10 mg Oral Daily   sodium chloride flush  3 mL Intravenous Q12H   Continuous Infusions:  sodium chloride       LOS: 1 day    Time spent: 35 mins    Ajia Chadderdon Maree, MD [facility] Pager [phone]  If 7PM-7AM, please contact night-coverage [email or web address]  03/25/2024,  3:13 PM

## 2024-03-25 NOTE — Telephone Encounter (Signed)
 Pharmacy Patient Advocate Encounter  Insurance verification completed.    The patient is insured through [facility]. Patient has [employer], may use a copay card, and/or apply for patient assistance if available.    Ran test claim for Prasugrel 10mg  and the current 30 day co-pay is $20.88.   This test claim was processed through [facility]- copay amounts may vary at other pharmacies due to pharmacy/plan contracts, or as the patient moves through the different stages of their insurance plan.

## 2024-03-25 NOTE — Plan of Care (Signed)
   Problem: Activity: Goal: Risk for activity intolerance will decrease Outcome: Progressing   Problem: Nutrition: Goal: Adequate nutrition will be maintained Outcome: Progressing   Problem: Elimination: Goal: Will not experience complications related to bowel motility Outcome: Progressing

## 2024-03-25 NOTE — Discharge Instructions (Signed)
 Some PCP options in [place] area- not a comprehensive list  [facility]- [phone] [facility]- [phone] [facility]- [phone] [facility]- [phone] [facility]- [phone] Nichole Molly- [phone]  or [facility] Physician Referral Line [phone]

## 2024-03-25 NOTE — Consult Note (Signed)
 Pharmacy Consult Note - Anticoagulation  Pharmacy Consult for heparin Indication: chest pain/ACS  PATIENT MEASUREMENTS: Height: 5' 4 (162.6 cm) Weight: 102.1 kg (225 lb) IBW/kg (Calculated) : 54.7 HEPARIN DW (KG): 78.5  VITAL SIGNS: Temp: 98.3 F (36.8 C) (10/27 0314) Temp Source: Oral (10/27 0314) BP: 92/63 (10/27 0314) Pulse Rate: 65 (10/27 0314)  Recent Labs    03/23/24 2109 03/23/24 2142 03/24/24 0502 03/25/24 0434  HGB  --   --    < > 12.8  HCT  --   --    < > 39.1  PLT  --   --    < > 196  APTT 30  --   --   --   LABPROT 13.0  --   --   --   INR 0.9  --   --   --   HEPARINUNFRC  --   --    < > 0.26*  CREATININE  --   --   --  0.63  TROPONINIHS  --  1,928*  --   --    < > = values in this interval not displayed.    Estimated Creatinine Clearance: 94.6 mL/min (by C-G formula based on SCr of 0.63 mg/dL).  PAST MEDICAL HISTORY: Past Medical History:  Diagnosis Date   Morbid obesity (HCC)    Tobacco abuse     ASSESSMENT: 53 y.o. female with PMH including obesity, tobacco use is presenting with left-sided neck and arm pain. hsTn elevated (1512 > 1928). Patient is not on chronic anticoagulation per chart review. Pharmacy has been consulted to initiate and manage heparin intravenous infusion.  Baseline labs 10/25: H&H 15/45, INR 0.9, aPTT 30  Pertinent medications: -Aspirin 324 mg PO x 1 10/25 @ 2141 -Aspirin 81mg  daily  Goal(s) of therapy: Heparin level 0.3 - 0.7 units/mL Monitor platelets by anticoagulation protocol: Yes   Baseline anticoagulation labs: Recent Labs    03/23/24 1950 03/23/24 2109 03/24/24 0502 03/25/24 0434  APTT  --  30  --   --   INR  --  0.9  --   --   HGB 15.3*  --  14.2 12.8  PLT 266  --  247 196    Date Time aPTT/HL Rate/Comment 10/26   0502      0.14              SUBtherapeutic @ 1000 units/hr   10/26 1244      0.40              Therapeutic x 1 @ 1300 units/hr 10/26 1927 0.31  Therapeutic x 2 @ 1300 10/27   0434   0.26                  SUBtherapeutic   PLAN: 10/27:  HL @ 0434 = 0.26, SUBtherapeutic  - will order heparin 1200 units IV X 1 bolus and increase drip rate to 1450 units/hr - recheck HL 6 hrs after rate change  Monitor CBC daily while on heparin infusion  Kiante Ciavarella D Clinical Pharmacist 03/25/2024 5:43 AM

## 2024-03-26 ENCOUNTER — Other Ambulatory Visit (HOSPITAL_COMMUNITY): Payer: Self-pay

## 2024-03-26 ENCOUNTER — Other Ambulatory Visit: Payer: Self-pay

## 2024-03-26 DIAGNOSIS — F17200 Nicotine dependence, unspecified, uncomplicated: Secondary | ICD-10-CM | POA: Diagnosis not present

## 2024-03-26 DIAGNOSIS — I214 Non-ST elevation (NSTEMI) myocardial infarction: Secondary | ICD-10-CM | POA: Diagnosis not present

## 2024-03-26 DIAGNOSIS — E785 Hyperlipidemia, unspecified: Secondary | ICD-10-CM | POA: Diagnosis not present

## 2024-03-26 DIAGNOSIS — E669 Obesity, unspecified: Secondary | ICD-10-CM | POA: Diagnosis not present

## 2024-03-26 LAB — CBC
HCT: 39.4 % (ref 36.0–46.0)
Hemoglobin: 12.7 g/dL (ref 12.0–15.0)
MCH: 30.9 pg (ref 26.0–34.0)
MCHC: 32.2 g/dL (ref 30.0–36.0)
MCV: 95.9 fL (ref 80.0–100.0)
Platelets: 186 K/uL (ref 150–400)
RBC: 4.11 MIL/uL (ref 3.87–5.11)
RDW: 12.6 % (ref 11.5–15.5)
WBC: 6 K/uL (ref 4.0–10.5)
nRBC: 0 % (ref 0.0–0.2)

## 2024-03-26 LAB — BASIC METABOLIC PANEL WITH GFR
Anion gap: 9 (ref 5–15)
BUN: 15 mg/dL (ref 6–20)
CO2: 23 mmol/L (ref 22–32)
Calcium: 8.5 mg/dL — ABNORMAL LOW (ref 8.9–10.3)
Chloride: 107 mmol/L (ref 98–111)
Creatinine, Ser: 0.66 mg/dL (ref 0.44–1.00)
GFR, Estimated: >60 mL/min (ref >60)
Glucose, Bld: 104 mg/dL — ABNORMAL HIGH (ref 70–99)
Potassium: 3.5 mmol/L (ref 3.5–5.1)
Sodium: 139 mmol/L (ref 135–145)

## 2024-03-26 LAB — LIPOPROTEIN A (LPA): Lipoprotein (a): 60 nmol/L — ABNORMAL HIGH (ref <75.0)

## 2024-03-26 MED ORDER — METOPROLOL TARTRATE 25 MG PO TABS
12.5000 mg | ORAL_TABLET | Freq: Two times a day (BID) | ORAL | 0 refills | Status: DC
  Filled 2024-03-26: qty 30, 30d supply, fill #0

## 2024-03-26 MED ORDER — ASPIRIN 81 MG PO TBEC
81.0000 mg | DELAYED_RELEASE_TABLET | Freq: Every day | ORAL | 0 refills | Status: DC
  Filled 2024-03-26: qty 30, 30d supply, fill #0

## 2024-03-26 MED ORDER — PRASUGREL HCL 10 MG PO TABS: 10.0000 mg | ORAL_TABLET | Freq: Every day | ORAL | 0 refills | Status: DC

## 2024-03-26 MED ORDER — ATORVASTATIN CALCIUM 80 MG PO TABS
80.0000 mg | ORAL_TABLET | Freq: Every day | ORAL | 0 refills | Status: DC
  Filled 2024-03-26: qty 30, 30d supply, fill #0

## 2024-03-26 MED ORDER — ASPIRIN 81 MG PO TBEC: 81.0000 mg | DELAYED_RELEASE_TABLET | Freq: Every day | ORAL | 0 refills | Status: DC

## 2024-03-26 MED ORDER — ATORVASTATIN CALCIUM 80 MG PO TABS: 80.0000 mg | ORAL_TABLET | Freq: Every day | ORAL | 0 refills | Status: DC

## 2024-03-26 MED ORDER — METOPROLOL TARTRATE 25 MG PO TABS: 12.5000 mg | ORAL_TABLET | Freq: Two times a day (BID) | ORAL | 0 refills | Status: DC

## 2024-03-26 MED ORDER — PRASUGREL HCL 10 MG PO TABS
10.0000 mg | ORAL_TABLET | Freq: Every day | ORAL | 0 refills | Status: DC
  Filled 2024-03-26 (×2): qty 30, 30d supply, fill #0

## 2024-03-26 NOTE — Progress Notes (Signed)
  Progress Note  Patient Name: Marisa Malone Date of Encounter: 03/26/2024 [facility] Cardiologist: Evalene Lunger, MD   Interval Summary   Feeling well without chest pain.  Was mildly short of breath immediately after PCI, though this has resolved.  Periprocedural HA has also resolved.  Vital Signs Vitals:   03/25/24 2207 03/25/24 2309 03/26/24 0412 03/26/24 0818  BP: 127/70 107/63 127/76 126/74  Pulse: 90 80 74 78  Resp:  18 18 18   Temp:  98.9 F (37.2 C) 98.4 F (36.9 C) 98 F (36.7 C)  TempSrc:      SpO2:  96% 98% 92%  Weight:      Height:        Intake/Output Summary (Last 24 hours) at 03/26/2024 0845 Last data filed at 03/26/2024 0402 Gross per 24 hour  Intake 240 ml  Output --  Net 240 ml      03/23/2024    4:48 PM  Last 3 Weights  Weight (lbs) 225 lb  Weight (kg) 102.059 kg      Telemetry/ECG  NSR - Personally Reviewed  Physical Exam  GEN: No acute distress.   Neck: No JVD Cardiac: RRR, no murmurs, rubs, or gallops.  Right radial cath site with mild bruising.  No hematoma; 2+ right radial pulse. Respiratory: Clear to auscultation bilaterally. GI: Soft, nontender, non-distended  MS: No edema  Assessment & Plan  NSTEMI: Cath yesterday showed occluded RCA successfully treated with DES x 1.  Feeling well today.  Echo earlier this admission with normal LVEF. -Continue ASA and prasugrel x 12 months. -Continue atorvastatin 80 mg daily for target LDL < 70 (ideally below 55). -Continue metoprolol tartrate 12.5 mg BID. -Outpatient cardiac rehab referral already placed.  Hyperlipidemia: -Continue atorvastatin 80 mg daily.  Will need f/u lipid panel and ALT in ~3 months.  Tobacco use: -Smoking cessation encouraged.  [facility] will sign off.   The patient is ready for discharge today from a cardiac standpoint. Medication Recommendations:  See above Other recommendations (labs, testing, etc):  Lipid panel/ALT in ~3  months Follow up as an outpatient:  1-2 weeks (message already sent to our office to schedule appointment)  For questions or updates, please contact [facility] Please consult [email or web address] for contact info under [facility].  Signed, Lonni Hanson, MD

## 2024-03-26 NOTE — TOC CM/SW Note (Signed)
 Transition of Care [facility]) - Inpatient Brief Assessment   Patient Details  Name: Marisa Malone MRN: [id number] Date of Birth: 1971/04/12  Transition of [facility]) CM/SW Contact:    Lauraine JAYSON Carpen, LCSW Phone Number: 03/26/2024, 9:25 AM   Clinical Narrative: Patient has orders to discharge home today. Chart reviewed. Patient does not have a PCP. Added list to AVS. No other TOC needs identified. CSW signing off.  Transition of Care Asessment: Insurance and Status: Insurance coverage has been reviewed Patient has primary care physician: No Home environment has been reviewed: Single family home Prior level of function:: Not documented Prior/Current Home Services: No current home services Social Drivers of Health Review: SDOH reviewed no interventions necessary Readmission risk has been reviewed: Yes Transition of care needs: no transition of care needs at this time

## 2024-03-28 NOTE — Discharge Summary (Signed)
 Physician Discharge Summary   Patient: Marisa Malone MRN: [id number] DOB: [birth year]  Admit date:     03/23/2024  Discharge date: 03/26/2024  Discharge Physician: Cresencio Fairly   PCP: Pcp, No   Recommendations at discharge:    F/up with outpt providers as requested f/u lipid panel and ALT in ~3 months.   Discharge Diagnoses: Principal Problem:   NSTEMI (non-ST elevated myocardial infarction) (HCC) Active Problems:   Tobacco use disorder   Obesity (BMI 30-39.9)  Hospital Course: Assessment and Plan:  53 y.o. female with medical history significant for tobacco use disorder being admitted with Angina and found to have NSTEMI.    10/26: cardio c/s, plan for cath on 10/27    * NSTEMI (non-ST elevated myocardial infarction) (HCC) Chest pain with typical CTS and troponin over 1500.  EKG nonacute  Risk factors of nicotine dependence, BMI 38 Cath on 10/27 showed occluded RCA successfully treated with DES x 1.  Feeling well today.  Echo earlier this admission with normal LVEF. -Continue ASA and prasugrel x 12 months. -Continue atorvastatin 80 mg daily for target LDL < 70 (ideally below 55). -Continue metoprolol tartrate 12.5 mg BID. -Outpatient cardiac rehab referral at DC   Tobacco use disorder Nicotine patch and tobacco cessation counseling   Obesity (BMI 30-39.9) Dietary and lifestyle modification   Tobacco Abuse Nicotine Patch           Consultants: Cardio Procedures performed: Cardiac Cath on 10/27  Disposition: Home Diet recommendation:  Discharge Diet Orders (From admission, onward)     Start     Ordered   03/26/24 0000  Diet - low sodium heart healthy        03/26/24 0914           Carb modified diet DISCHARGE MEDICATION: Allergies as of 03/26/2024   No Known Allergies      Medication List     TAKE these medications    aspirin EC 81 MG tablet Take 1 tablet (81 mg total) by mouth daily. Swallow whole.   atorvastatin 80 MG  tablet Commonly known as: LIPITOR Take 1 tablet (80 mg total) by mouth daily.   metoprolol tartrate 25 MG tablet Commonly known as: LOPRESSOR Take 0.5 tablets (12.5 mg total) by mouth 2 (two) times daily.   prasugrel 10 MG Tabs tablet Commonly known as: EFFIENT Take 1 tablet (10 mg total) by mouth daily.   prasugrel 10 MG Tabs tablet Commonly known as: EFFIENT Take 1 tablet (10 mg total) by mouth daily.        Follow-up Information     Marikay Eva POUR, PA. Schedule an appointment as soon as possible for a visit in 1 week(s).   Specialty: Physician Assistant Why: [facility] Discharge F/UP Contact information: [address] [facility] [place] [address] [phone]         Vivienne Lonni Ingle, NP. Schedule an appointment as soon as possible for a visit in 2 week(s).   Specialties: Cardiology, Radiology Why: [facility] Discharge F/UP Contact information: [address] [place] [address] [phone]                Discharge Exam: Filed Weights   03/23/24 1648  Weight: 102.1 kg   GEN: No acute distress.   Neck: No JVD Cardiac: RRR, no murmurs, rubs, or gallops.  Right radial cath site with mild bruising.  No hematoma; 2+ right radial pulse. Respiratory: Clear to auscultation bilaterally. GI: Soft,  nontender, non-distended  MS: No edema  Condition at discharge: good  The results of significant diagnostics from this hospitalization (including imaging, microbiology, ancillary and laboratory) are listed below for reference.   Imaging Studies: CARDIAC CATHETERIZATION Result Date: 03/25/2024   Mid LAD lesion is 30% stenosed.   Prox RCA to Mid RCA lesion is 100% stenosed.   Prox RCA lesion is 20% stenosed.   A drug-eluting stent was successfully placed using a STENT ONYX FRONTIER 3.0X26.   Post intervention, there is a 0% residual stenosis.   In the absence of any other complications or medical issues, we  expect the patient to be ready for discharge from an interventional cardiology perspective on 03/26/2024.   Recommend uninterrupted dual antiplatelet therapy with Aspirin 81mg  daily and Prasugrel 10mg  daily for a minimum of 12 months (ACS-Class I recommendation). 1.  Severe one-vessel coronary artery disease with occluded proximal right coronary artery with left-to-right collaterals.  This is the likely culprit for myocardial infarction.  No other obstructive disease. 2.  Left ventricular angiography was not performed.  EF was normal by echo.  Moderately elevated left ventricular end-diastolic pressure at 22 mmHg. 3.  Successful angioplasty and drug-eluting stent placement to the proximal right coronary artery. Recommendations: Aggressive treatment of risk factors smoking cessation.   ECHOCARDIOGRAM COMPLETE Result Date: 03/24/2024    ECHOCARDIOGRAM REPORT   Patient Name:   Marisa Malone Date of Exam: 03/24/2024 Medical Rec #:  [id number]         Height:       64.0 in Accession #:    [id number]        Weight:       225.0 lb Date of Birth:  [birth year]         BSA:          2.057 m Patient Age:    53 years          BP:           95/59 mmHg Patient Gender: F                 HR:           62 bpm. Exam Location:  [facility] Procedure: 2D Echo, Cardiac Doppler, Color Doppler and Strain Analysis (Both            Spectral and Color Flow Doppler were utilized during procedure). Indications:     Chest Pain R07.9  History:         Patient has no prior history of Echocardiogram examinations.  Sonographer:     Thedora Louder RDCS, FASE Referring Phys:  [id number] LONNI INGLE BERGE Diagnosing Phys: Evalene Lunger MD  Sonographer Comments: Global longitudinal strain was attempted. IMPRESSIONS  1. Left ventricular ejection fraction, by estimation, is 60 to 65%. The left ventricle has normal function. The left ventricle has no regional wall motion abnormalities. Left ventricular diastolic parameters were normal.  2. Right  ventricular systolic function is normal. The right ventricular size is normal. There is normal pulmonary artery systolic pressure. The estimated right ventricular systolic pressure is 23.1 mmHg.  3. The mitral valve is normal in structure. No evidence of mitral valve regurgitation. No evidence of mitral stenosis.  4. The aortic valve is normal in structure. Aortic valve regurgitation is not visualized. No aortic stenosis is present.  5. The inferior vena cava is normal in size with greater than 50% respiratory variability, suggesting right atrial pressure of 3 mmHg. FINDINGS  Left Ventricle: Left ventricular  ejection fraction, by estimation, is 60 to 65%. The left ventricle has normal function. The left ventricle has no regional wall motion abnormalities. Strain was performed and the global longitudinal strain is indeterminate. The left ventricular internal cavity size was normal in size. There is no left ventricular hypertrophy. Left ventricular diastolic parameters were normal. Right Ventricle: The right ventricular size is normal. No increase in right ventricular wall thickness. Right ventricular systolic function is normal. There is normal pulmonary artery systolic pressure. The tricuspid regurgitant velocity is 2.13 m/s, and  with an assumed right atrial pressure of 5 mmHg, the estimated right ventricular systolic pressure is 23.1 mmHg. Left Atrium: Left atrial size was normal in size. Right Atrium: Right atrial size was normal in size. Pericardium: There is no evidence of pericardial effusion. Mitral Valve: The mitral valve is normal in structure. No evidence of mitral valve regurgitation. No evidence of mitral valve stenosis. Tricuspid Valve: The tricuspid valve is normal in structure. Tricuspid valve regurgitation is not demonstrated. No evidence of tricuspid stenosis. Aortic Valve: The aortic valve is normal in structure. Aortic valve regurgitation is not visualized. No aortic stenosis is present. Aortic  valve peak gradient measures 7.4 mmHg. Pulmonic Valve: The pulmonic valve was normal in structure. Pulmonic valve regurgitation is not visualized. No evidence of pulmonic stenosis. Aorta: The aortic root is normal in size and structure. Venous: The inferior vena cava is normal in size with greater than 50% respiratory variability, suggesting right atrial pressure of 3 mmHg. IAS/Shunts: No atrial level shunt detected by color flow Doppler. Additional Comments: 3D was performed not requiring image post processing on an independent workstation and was indeterminate.  LEFT VENTRICLE PLAX 2D LVIDd:         4.00 cm   Diastology LVIDs:         2.80 cm   LV e' medial:    7.51 cm/s LV PW:         0.90 cm   LV E/e' medial:  10.6 LV IVS:        1.10 cm   LV e' lateral:   9.46 cm/s LVOT diam:     1.80 cm   LV E/e' lateral: 8.4 LV SV:         54 LV SV Index:   26 LVOT Area:     2.54 cm  RIGHT VENTRICLE RV Basal diam:  3.70 cm TAPSE (M-mode): 1.6 cm LEFT ATRIUM             Index        RIGHT ATRIUM           Index LA diam:        3.30 cm 1.60 cm/m   RA Area:     11.90 cm LA Vol (A2C):   38.1 ml 18.52 ml/m  RA Volume:   30.60 ml  14.88 ml/m LA Vol (A4C):   25.0 ml 12.15 ml/m LA Biplane Vol: 32.3 ml 15.70 ml/m  AORTIC VALVE                 PULMONIC VALVE AV Area (Vmax): 1.85 cm     PV Vmax:        0.93 m/s AV Vmax:        136.00 cm/s  PV Peak grad:   3.5 mmHg AV Peak Grad:   7.4 mmHg     RVOT Peak grad: 2 mmHg LVOT Vmax:      99.00 cm/s LVOT Vmean:     65.100  cm/s LVOT VTI:       0.212 m  AORTA Ao Root diam: 2.90 cm MITRAL VALVE               TRICUSPID VALVE MV Area (PHT): 3.07 cm    TR Peak grad:   18.1 mmHg MV Decel Time: 247 msec    TR Vmax:        213.00 cm/s MV E velocity: 79.70 cm/s MV A velocity: 74.90 cm/s  SHUNTS MV E/A ratio:  1.06        Systemic VTI:  0.21 m                            Systemic Diam: 1.80 cm Evalene Lunger MD Electronically signed by Evalene Lunger MD Signature Date/Time: 03/24/2024/3:40:46 PM     Final    CT Angio Chest Pulmonary Embolism (PE) W or WO Contrast Result Date: 03/23/2024 EXAM: Cta Of The Chest With Contrast For Pe 03/23/2024 09:40:13 Pm TECHNIQUE: Cta Of The Chest Was Performed After The Administration Of Intravenous Contrast ( Iohexol (Omnipaque) 350 Mg/Ml Injection). Multiplanar Reformatted Images Are Provided For Review. Mip Images Are Provided For Review. Automated Exposure Control, Iterative Reconstruction, And/Or Weight-Based Adjustment Of The Ma/Kv Was Utilized To Reduce The Radiation Dose To As Low As Reasonably Achievable. COMPARISON: None Available. CLINICAL HISTORY: Evaluate For Pe. Pt To Ed For L Neck Pain And Arm Pain Since About 2 Weeks. Has Full Rom To Arms And Shoulders. Pain Is Sharp. No Lymphadenopathy To Neck Or Face. 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 FINDINGS: PULMONARY ARTERIES: Pulmonary Arteries Are Adequately Opacified For Evaluation. No Pulmonary Embolism. Central Pulmonary Arteries Are Of Normal Caliber. MEDIASTINUM: The Heart And Pericardium Demonstrate No Acute Abnormality. Global Cardiac Size Within Normal Limits. No Pericardial Effusion. Mild Coronary Artery Calcification. LYMPH NODES: No Mention Of Lymphadenopathy. LUNGS AND PLEURA: Attenuation Of The Pulmonary Parenchyma Is Present In Keeping With Multifocal Air Trapping, Likely Related To Small Airway Disease. Mild Bronchial Wall Thickening Noted In Keeping With Airway Inflammation. The Lungs Are Not Without Acute Process. UPPER ABDOMEN: Limited Images Of The Upper Abdomen Are Unremarkable. SOFT TISSUES AND BONES: Mild-To-Moderate Degenerative Changes Seen Throughout The Thoracic Spine. No Acute Bone Or Soft Tissue Abnormality. Raf Score: * Aortic Atherosclerosis (Icd10-I70.0) IMPRESSION: 1. No pulmonary embolism. 2. Multifocal air trapping and mild bronchial wall thickening, likely related to small airway disease and airway inflammation. 3.  Mild coronary artery calcification. 4. Mild-to-moderate degenerative changes throughout the thoracic spine. 5. raf score: Aortic atherosclerosis (ICD10-I70.0) Electronically signed by: Dorethia Molt MD 03/23/2024 10:02 PM EDT RP Workstation: [id number]   DG Chest 2 View Result Date: 03/23/2024 EXAM: 2 VIEW(S) XRAY OF THE CHEST 03/23/2024 08:42:29 PM COMPARISON: None available. CLINICAL HISTORY: Pain. PER ER NOTE; Pt to ED for L neck pain and arm pain since about 2 weeks. Has full ROM to arms and shoulders. Pain is sharp. No lymphadenopathy to neck or face. 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 ; PATIENT STATES  SMOKER X 20 PLUS YEARS, PACK-PACK 1/2 DAILY  FINDINGS: LUNGS AND PLEURA: No focal pulmonary opacity. No pulmonary edema. No pleural effusion. No pneumothorax. HEART AND MEDIASTINUM: No acute abnormality of the cardiac and mediastinal silhouettes. BONES AND SOFT TISSUES: No acute osseous abnormality. IMPRESSION: 1. No acute cardiopulmonary process. Electronically  signed by: Pinkie Pebbles MD 03/23/2024 08:51 PM EDT RP Workstation: [id number]    Microbiology: No results found for this or any previous visit.  Labs: CBC: Recent Labs  Lab 03/23/24 1950 03/24/24 0502 03/25/24 0434 03/26/24 0441  WBC 7.1 8.0 8.3 6.0  NEUTROABS 4.4  --   --   --   HGB 15.3* 14.2 12.8 12.7  HCT 45.3 42.2 39.1 39.4  MCV 93.8 93.8 95.6 95.9  PLT 266 247 196 186   Basic Metabolic Panel: Recent Labs  Lab 03/23/24 1950 03/25/24 0434 03/26/24 0441  NA 138 138 139  K 3.9 3.9 3.5  CL 106 105 107  CO2 23 24 23   GLUCOSE 99 103* 104*  BUN 23* 11 15  CREATININE 0.65 0.63 0.66  CALCIUM 9.3 8.6* 8.5*   Liver Function Tests: Recent Labs  Lab 03/23/24 1950  AST 35  ALT 25  ALKPHOS 47  BILITOT 0.6  PROT 7.4  ALBUMIN 4.1   CBG: No results for input(s): GLUCAP in the last 168 hours.  Discharge time spent: greater than 30  minutes.  Signed: Cresencio Fairly, MD [facility] 03/28/2024

## 2024-04-04 ENCOUNTER — Other Ambulatory Visit: Payer: Self-pay | Admitting: Family Medicine

## 2024-04-04 DIAGNOSIS — Z1231 Encounter for screening mammogram for malignant neoplasm of breast: Secondary | ICD-10-CM

## 2024-04-11 NOTE — Progress Notes (Signed)
 Cardiology Office Note:    Date:  04/12/2024   ID:  Marisa Malone, DOB 12-07-1970, MRN [id number]  PCP:  Harvey Gaetana CROME, NP   [facility] Providers Cardiologist:  Evalene Lunger, MD Cardiology APP:  Vivienne Lonni Ingle, NP     Referring MD: No ref. provider found   Chief complaint: NSTEMI Hospital follow-up     History of Present Illness:   Marisa Malone is a 53 y.o. female with a hx of morbid obesity, tobacco use, presenting the office today for first outpatient follow-up of recent NSTEMI.    Presented to the ED on 03/23/2024 complaining of left-sided neck pain, left-sided chest pain/pressure radiating to the left arm that started 2 days prior with some difficulties breathing. EKG showed no acute changes, NSR, troponin at 1500, admitted for NSTEMI.  Echo 03/24/2024 showed LVEF 60-65%, no RWMA, diastolic parameters normal, normal RV, normal mitral valve, normal aortic valve, RA pressure 3 mmHg.  LHC 03/25/2024: Mid LAD lesion 30% stenosed, proximal-mid RCA 100% stenosed with left-to-right collaterals, 3.0 X26 DES deployed.  LVEDP 22 mmHg.  DAPT with prasugrel 10 mg and ASA 81 mg daily recommended X 1 year.  Aspirin, Lipitor, Effient and metoprolol started while inpatient.  Follow-up with PCP on 04/04/2024 shows patient was attempting to reduce her smoking frequency, reported anxiety is returning to work with physically demanding tasks.  Previously did not seek regular medical care, and had reportedly not had a physical exam since 2017 prior to this hospital visit.  Presents independently, doing well from cardiovascular standpoint. She denies chest pain, palpitations, dyspnea, orthopnea, n, v, dark/tarry/bloody stools, hematuria, dizziness, syncope, edema, weight gain. She denies any abnormal bleeding/bruising. Has recently switched from smoking cigarettes to using a vape pen in an attempt to wean herself off her nicotine use. Her primary concern today revolves  around her planned return to work on Monday (04/15/2024) secondary to anxieties around heavy lifting. Works at [employer] in [place], and reports frequently needing to lift heavy TVs on her own. She has stairs she climbs at  home, but other than that has been inactive since her NSTEMI.  ROS:   Please see the history of present illness.    All other systems reviewed and are negative.     Past Medical History:  Diagnosis Date   CAD (coronary artery disease)    a. 02/2024 NSTEMI/PCI: LM nl, LAD 88m, LCX mild diff dzs, RCA 20p, 100p/m (3.0x26 Onyx Frontier DES).  L->R collats to RPDA/RPL3.   History of echocardiogram    a. 02/2024 Echo: EF 60-65%, no rmwa, nl RV fxn, RVSP 23.69mmHg.   Hyperlipidemia LDL goal <55    Morbid obesity (HCC)    Tobacco abuse     Past Surgical History:  Procedure Laterality Date   CORONARY STENT INTERVENTION N/A 03/25/2024   Procedure: CORONARY STENT INTERVENTION;  Surgeon: Darron Deatrice LABOR, MD;  Location: [facility] INVASIVE CV LAB;  Service: Cardiovascular;  Laterality: N/A;   LEFT HEART CATH AND CORONARY ANGIOGRAPHY N/A 03/25/2024   Procedure: LEFT HEART CATH AND CORONARY ANGIOGRAPHY;  Surgeon: Darron Deatrice LABOR, MD;  Location: [facility] INVASIVE CV LAB;  Service: Cardiovascular;  Laterality: N/A;    Current Medications: Current Meds  Medication Sig   aspirin EC 81 MG tablet Take 1 tablet (81 mg total) by mouth daily. Swallow whole.   atorvastatin (LIPITOR) 80 MG tablet Take 1 tablet (80 mg total) by mouth daily.   metoprolol tartrate (LOPRESSOR) 25 MG tablet Take  0.5 tablets (12.5 mg total) by mouth 2 (two) times daily.   prasugrel (EFFIENT) 10 MG TABS tablet Take 1 tablet (10 mg total) by mouth daily.     Allergies:   Patient has no known allergies.   Social History   Socioeconomic History   Marital status: Married    Spouse name: Not on file   Number of children: Not on file   Years of education: Not on file   Highest education level: Not  on file  Occupational History   Not on file  Tobacco Use   Smoking status: Every Day    Current packs/day: 2.00    Average packs/day: 2.0 packs/day for 37.9 years (75.7 ttl pk-yrs)    Types: Cigarettes    Start date: 89   Smokeless tobacco: Never  Vaping Use   Vaping status: Some Days  Substance and Sexual Activity   Alcohol use: Not Currently   Drug use: Never   Sexual activity: Not on file  Other Topics Concern   Not on file  Social History Narrative   Lives locally w/ mother.  Does not routinely exercise.  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   Social Drivers of Corporate Investment Banker Strain: Low Risk  (04/04/2024)   Received from [facility]   Overall Financial Resource Strain (CARDIA)    Difficulty of Paying Living Expenses: Not hard at all  Food Insecurity: No Food Insecurity (04/04/2024)   Received from [facility]   Hunger Vital Sign    Within the past 12 months, you worried that your food would run out before you got the money to buy more.: Never true    Within the past 12 months, the food you bought just didn't last and you didn't have money to get more.: Never true  Transportation Needs: No Transportation Needs (04/04/2024)   Received from [facility] - Transportation    In the past 12 months, has lack of transportation kept you from medical appointments or from getting medications?: No    Lack of Transportation (Non-Medical): No  Physical Activity: Not on file  Stress: Not on file  Social Connections: Not on file     Family History: The patient's family history includes Heart failure in her brother; Stroke (age of onset: 69) in her mother.  EKGs/Labs/Other Studies Reviewed:    The following studies were reviewed today:  EKG Interpretation Date/Time:  Friday April 12 2024 08:47:49 EST Ventricular Rate:  76 PR Interval:  172 QRS Duration:  72 QT  Interval:  388 QTC Calculation: 436 R Axis:   -27  Text Interpretation: Normal sinus rhythm Low voltage QRS Septal infarct , age undetermined No significant change since prior study Confirmed by Elaine Moloney [phone]) on 04/12/2024 9:12:05 AM    Recent Labs: 03/23/2024: ALT 25; B Natriuretic Peptide 50.0 03/26/2024: BUN 15; Creatinine, Ser 0.66; Hemoglobin 12.7; Platelets 186; Potassium 3.5; Sodium 139  Recent Lipid Panel    Component Value Date/Time   CHOL 169 03/25/2024 0435   TRIG 297 (H) 03/25/2024 0435   HDL 29 (L) 03/25/2024 0435   CHOLHDL 5.8 03/25/2024 0435   VLDL 59 (H) 03/25/2024 0435   LDLCALC 81 03/25/2024 0435     Risk Assessment/Calculations:      HYPERTENSION CONTROL Vitals:   04/12/24 0842 04/12/24 1232  BP: (!) 150/90 (!) 156/82    The patient's blood pressure is elevated above target today.  In order to address the patient's elevated BP: Blood pressure will be monitored at home to determine if medication changes need to be made.            Physical Exam:    VS:  BP (!) 156/82 (BP Location: Left Arm)   Pulse 76   Ht 5' 4 (1.626 m)   Wt 218 lb 6 oz (99.1 kg)   SpO2 99%   BMI 37.48 kg/m        Wt Readings from Last 3 Encounters:  04/12/24 218 lb 6 oz (99.1 kg)  03/23/24 225 lb (102.1 kg)     GEN:  Well nourished, well developed in no acute distress HEENT: Normal NECK:  No carotid bruits CARDIAC:  S1-S2 normal, RRR, no murmurs, rubs, gallops RESPIRATORY:  Clear to auscultation without rales, wheezing or rhonchi  MUSCULOSKELETAL:  No edema; No deformity  SKIN: Warm and dry NEUROLOGIC:  Alert and oriented x 3 PSYCHIATRIC:  Normal affect       Assessment & Plan Non-ST elevation myocardial infarction (NSTEMI), subsequent episode of care [facility]) Coronary artery disease involving native coronary artery of native heart without angina pectoris Proximal-mid RCA 100% stenosed with left-to-right collaterals, 3.0 X26 DES deployed.  LVEDP 22  mmHg.  DAPT with prasugrel 10 mg and ASA 81 mg daily recommended X 1 year. Echo 03/24/2024 showed LVEF 60-65%, no RWMA, diastolic parameters normal, EKG: Normal sinus rhythm Low voltage QRS Septal infarct , age undetermined, 76 bpm, no significant change from prior study Denies CP, SOB, palpitations, near syncope, back pain Right wrist appears well healed, small non-tender density over insertion site, no palpable thrill or audible bruit, non-compressible, suspect healing/residual hematoma.  Denies any missed doses of her effient or asa Continue aspirin EC 81 mg daily Continue atorvastatin 80 mg daily Continue Lopressor 12.5 mg twice daily Continue Effient 10 mg daily FMLA paperwork filled out at today's visit Hypertension, unspecified type Hypertensive in clinic today at 156/82 on recheck Has a BP cuff at home but does not regularly check it Previous vital signs during hospital admissions appear well controlled, averaging in the 120s, with the lowest systolic at 101 Advising patient to keep a daily BP log until her f/u appointment, will re-address BP at that time Hyperlipidemia LDL goal <55 Lipid panel 03/25/2024: Cholesterol 169, HDL 29, LDL 81, triglycerides 297 Recently started on atorvastatin 80 mg daily, to be continued Recheck lipids/LFTs at follow-up appointment in 1 month Tobacco use disorder Has recently stopped smoking cigarettes and switched to vaping to try to titrate herself off nicotine Her previous attempt at smoking cessation led to weight gain after trying to stop cold turkey, which she is trying to avoid through the use of her vape pen We did discuss the risks of smoking/vaping  Can return to work, no heavy lifting >50 lb x 2 weeks Follow-up with [place] in 1 month    Cardiac Rehabilitation Eligibility Assessment  The patient is ready to start cardiac rehabilitation from a cardiac standpoint.      Medication Adjustments/Labs and Tests Ordered: Current medicines are  reviewed at length with the patient today.  Concerns regarding medicines are outlined above.  Orders Placed This Encounter  Procedures   EKG 12-Lead   No orders of the defined types were placed in this encounter.   Patient Instructions  Medication Instructions:  No changes *If you need a refill on your cardiac medications before your next appointment, please call your pharmacy*  Lab Work: None ordered  If you have labs (blood work) drawn today and your tests are completely normal, you will receive your results only by: MyChart Message (if you have MyChart) OR A paper copy in the mail If you have any lab test that is abnormal or we need to change your treatment, we will call you to review the results.  Testing/Procedures: None ordered  Follow-Up: At [facility], you and your health needs are our priority.  As part of our continuing mission to provide you with exceptional heart care, our providers are all part of one team.  This team includes your primary Cardiologist (physician) and Advanced Practice Providers or APPs (Physician Assistants and Nurse Practitioners) who all work together to provide you with the care you need, when you need it.  Your next appointment:   1 month(s)  Provider:   Lonni Meager, NP    We recommend signing up for the patient portal called MyChart.  Sign up information is provided on this After Visit Summary.  MyChart is used to connect with patients for Virtual Visits (Telemedicine).  Patients are able to view lab/test results, encounter notes, upcoming appointments, etc.  Non-urgent messages can be sent to your provider as well.   To learn more about what you can do with MyChart, go to [email or web address].   Other Instructions Please check your blood pressure daily for the next month.  Keep a journal of these daily blood pressure and heart rate readings and call our office or send a message through MyChart with the results. Thank  you!  It is best to check your BP 1-2 hours after taking your medications to see the medications effectiveness on your BP.    Here are some tips that our clinical pharmacists share for home BP monitoring:          Rest 10 minutes before taking your blood pressure.          Don't smoke or drink caffeinated beverages for at least 30 minutes before.          Take your blood pressure before (not after) you eat.          Sit comfortably with your back supported and both feet on the floor (don't cross your legs).          Elevate your arm to heart level on a table or a desk.          Use the proper sized cuff. It should fit smoothly and snugly around your bare upper arm. There should be enough room to slip a fingertip under the cuff. The bottom edge of the cuff should be 1 inch above the crease of the elbow.           Signed, Miriam FORBES Shams, NP  04/12/2024 12:43 PM    [facility]

## 2024-04-12 ENCOUNTER — Encounter: Payer: Self-pay | Admitting: Nurse Practitioner

## 2024-04-12 ENCOUNTER — Ambulatory Visit: Attending: Nurse Practitioner | Admitting: Emergency Medicine

## 2024-04-12 VITALS — BP 156/82 | HR 76 | Ht 64.0 in | Wt 218.4 lb

## 2024-04-12 DIAGNOSIS — I214 Non-ST elevation (NSTEMI) myocardial infarction: Secondary | ICD-10-CM | POA: Diagnosis not present

## 2024-04-12 DIAGNOSIS — E785 Hyperlipidemia, unspecified: Secondary | ICD-10-CM | POA: Diagnosis not present

## 2024-04-12 DIAGNOSIS — I2510 Atherosclerotic heart disease of native coronary artery without angina pectoris: Secondary | ICD-10-CM

## 2024-04-12 DIAGNOSIS — I10 Essential (primary) hypertension: Secondary | ICD-10-CM | POA: Diagnosis not present

## 2024-04-12 DIAGNOSIS — F17200 Nicotine dependence, unspecified, uncomplicated: Secondary | ICD-10-CM

## 2024-04-12 NOTE — Assessment & Plan Note (Signed)
 Proximal-mid RCA 100% stenosed with left-to-right collaterals, 3.0 X26 DES deployed.  LVEDP 22 mmHg.  DAPT with prasugrel 10 mg and ASA 81 mg daily recommended X 1 year. Echo 03/24/2024 showed LVEF 60-65%, no RWMA, diastolic parameters normal, EKG: Normal sinus rhythm Low voltage QRS Septal infarct , age undetermined, 76 bpm, no significant change from prior study Denies CP, SOB, palpitations, near syncope, back pain Right wrist appears well healed, small non-tender density over insertion site, no palpable thrill or audible bruit, non-compressible, suspect healing/residual hematoma.  Denies any missed doses of her effient or asa Continue aspirin EC 81 mg daily Continue atorvastatin 80 mg daily Continue Lopressor 12.5 mg twice daily Continue Effient 10 mg daily FMLA paperwork filled out at today's visit

## 2024-04-12 NOTE — Assessment & Plan Note (Signed)
 Has recently stopped smoking cigarettes and switched to vaping to try to titrate herself off nicotine Her previous attempt at smoking cessation led to weight gain after trying to stop cold turkey, which she is trying to avoid through the use of her vape pen We did discuss the risks of smoking/vaping

## 2024-04-12 NOTE — Patient Instructions (Signed)
 Medication Instructions:  No changes *If you need a refill on your cardiac medications before your next appointment, please call your pharmacy*  Lab Work: None ordered If you have labs (blood work) drawn today and your tests are completely normal, you will receive your results only by: MyChart Message (if you have MyChart) OR A paper copy in the mail If you have any lab test that is abnormal or we need to change your treatment, we will call you to review the results.  Testing/Procedures: None ordered  Follow-Up: At [facility], you and your health needs are our priority.  As part of our continuing mission to provide you with exceptional heart care, our providers are all part of one team.  This team includes your primary Cardiologist (physician) and Advanced Practice Providers or APPs (Physician Assistants and Nurse Practitioners) who all work together to provide you with the care you need, when you need it.  Your next appointment:   1 month(s)  Provider:   Lonni Meager, NP    We recommend signing up for the patient portal called MyChart.  Sign up information is provided on this After Visit Summary.  MyChart is used to connect with patients for Virtual Visits (Telemedicine).  Patients are able to view lab/test results, encounter notes, upcoming appointments, etc.  Non-urgent messages can be sent to your provider as well.   To learn more about what you can do with MyChart, go to [email or web address].   Other Instructions Please check your blood pressure daily for the next month.  Keep a journal of these daily blood pressure and heart rate readings and call our office or send a message through MyChart with the results. Thank you!  It is best to check your BP 1-2 hours after taking your medications to see the medications effectiveness on your BP.    Here are some tips that our clinical pharmacists share for home BP monitoring:          Rest 10 minutes before taking  your blood pressure.          Don't smoke or drink caffeinated beverages for at least 30 minutes before.          Take your blood pressure before (not after) you eat.          Sit comfortably with your back supported and both feet on the floor (don't cross your legs).          Elevate your arm to heart level on a table or a desk.          Use the proper sized cuff. It should fit smoothly and snugly around your bare upper arm. There should be enough room to slip a fingertip under the cuff. The bottom edge of the cuff should be 1 inch above the crease of the elbow.

## 2024-04-15 ENCOUNTER — Telehealth: Payer: Self-pay | Admitting: Cardiovascular Disease

## 2024-04-15 NOTE — Telephone Encounter (Signed)
*  STAT* If patient is at the pharmacy, call can be transferred to refill team.   1. Which medications need to be refilled? (please list name of each medication and dose if known)  aspirin EC 81 MG tablet  atorvastatin (LIPITOR) 80 MG tablet  metoprolol tartrate (LOPRESSOR) 25 MG tablet  prasugrel (EFFIENT) 10 MG TABS tablet    2. Would you like to learn more about the convenience, safety, & potential cost savings by using the [facility]?    3. Are you open to using the [facility] (Type [facility]. ).   4. Which pharmacy/location (including street and city if local pharmacy) is medication to be sent to? [facility] [address] [place] - [address]    5. Do they need a 30 day or 90 day supply? 30 day

## 2024-04-16 MED ORDER — METOPROLOL TARTRATE 25 MG PO TABS: 12.5000 mg | ORAL_TABLET | Freq: Two times a day (BID) | ORAL | 5 refills | Status: DC

## 2024-04-16 MED ORDER — ATORVASTATIN CALCIUM 80 MG PO TABS: 80.0000 mg | ORAL_TABLET | Freq: Every day | ORAL | 5 refills | Status: AC

## 2024-04-16 MED ORDER — ASPIRIN 81 MG PO TBEC: 81.0000 mg | DELAYED_RELEASE_TABLET | Freq: Every day | ORAL | 5 refills | Status: AC

## 2024-04-16 MED ORDER — PRASUGREL HCL 10 MG PO TABS: 10.0000 mg | ORAL_TABLET | Freq: Every day | ORAL | 5 refills | Status: AC

## 2024-04-16 NOTE — Telephone Encounter (Signed)
 Refills have been sent to pharmacy

## 2024-04-17 ENCOUNTER — Telehealth: Payer: Self-pay | Admitting: Cardiovascular Disease

## 2024-04-17 NOTE — Telephone Encounter (Signed)
 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 Paperwork from [employer] is being faxed regarding having 50 lb lifting restriction being removed.

## 2024-04-18 DIAGNOSIS — Z0279 Encounter for issue of other medical certificate: Secondary | ICD-10-CM

## 2024-04-18 LAB — COLOGUARD: COLOGUARD: NEGATIVE

## 2024-04-18 NOTE — Telephone Encounter (Signed)
 Patient came by office Filled out [facility] forms and paid $29 fee cash  Placed in Lisa's box to be completed

## 2024-04-23 ENCOUNTER — Telehealth: Payer: Self-pay | Admitting: Cardiovascular Disease

## 2024-04-23 NOTE — Telephone Encounter (Signed)
 Pt informed we will contact her once forms are completed. Pt verbalized understanding.

## 2024-04-23 NOTE — Telephone Encounter (Signed)
 Patient is calling because [employer] is going to be faxing over some forms that may need to be filled out. 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 She just wanted to make sure that we keep an eye out for the paperwork.

## 2024-04-23 NOTE — Telephone Encounter (Signed)
 Completed forms scanned in chart and faxed to [facility]

## 2024-05-16 ENCOUNTER — Ambulatory Visit: Admitting: Nurse Practitioner

## 2024-05-16 ENCOUNTER — Encounter: Payer: Self-pay | Admitting: Nurse Practitioner

## 2024-05-16 VITALS — BP 130/80 | HR 81 | Ht 64.0 in | Wt 215.4 lb

## 2024-05-16 DIAGNOSIS — E785 Hyperlipidemia, unspecified: Secondary | ICD-10-CM | POA: Diagnosis not present

## 2024-05-16 DIAGNOSIS — I10 Essential (primary) hypertension: Secondary | ICD-10-CM

## 2024-05-16 DIAGNOSIS — I2510 Atherosclerotic heart disease of native coronary artery without angina pectoris: Secondary | ICD-10-CM | POA: Diagnosis not present

## 2024-05-16 DIAGNOSIS — F17200 Nicotine dependence, unspecified, uncomplicated: Secondary | ICD-10-CM | POA: Diagnosis not present

## 2024-05-16 MED ORDER — METOPROLOL TARTRATE 25 MG PO TABS: 25.0000 mg | ORAL_TABLET | Freq: Two times a day (BID) | ORAL | 5 refills | Status: AC

## 2024-05-16 NOTE — Patient Instructions (Signed)
 Medication Instructions:  - INCREASE metoprolol  to 25 mg twice daily   *If you need a refill on your cardiac medications before your next appointment, please call your pharmacy*  Lab Work: Your provider would like for you to have following labs drawn today lipid fasting, liver function test.   If you have labs (blood work) drawn today and your tests are completely normal, you will receive your results only by: MyChart Message (if you have MyChart) OR A paper copy in the mail If you have any lab test that is abnormal or we need to change your treatment, we will call you to review the results.  Testing/Procedures: No test ordered today   Follow-Up: At [facility], you and your health needs are our priority.  As part of our continuing mission to provide you with exceptional heart care, our providers are all part of one team.  This team includes your primary Cardiologist (physician) and Advanced Practice Providers or APPs (Physician Assistants and Nurse Practitioners) who all work together to provide you with the care you need, when you need it.  Your next appointment:   3 month(s)  Provider:   Timothy Gollan, MD or Lonni Meager, NP    We recommend signing up for the patient portal called MyChart.  Sign up information is provided on this After Visit Summary.  MyChart is used to connect with patients for Virtual Visits (Telemedicine).  Patients are able to view lab/test results, encounter notes, upcoming appointments, etc.  Non-urgent messages can be sent to your provider as well.   To learn more about what you can do with MyChart, go to [email or web address].

## 2024-05-16 NOTE — Progress Notes (Signed)
 Office Visit    Patient Name: Marisa Malone Date of Encounter: 05/16/2024  Primary Care Provider:  Harvey Gaetana CROME, NP Primary Cardiologist:  Evalene Lunger, MD  Cardiology APP:  Vivienne Lonni Ingle, NP   Chief Complaint    53 y.o. female with a history of CAD status post non-STEMI and PCI to RCA in October 2025, hyperlipidemia, morbid obesity, and tobacco abuse, who presents for CAD follow-up.  Past Medical History   Subjective   Past Medical History:  Diagnosis Date   CAD (coronary artery disease)    a. 02/2024 NSTEMI/PCI: LM nl, LAD 21m, LCX mild diff dzs, RCA 20p, 100p/m (3.0x26 Onyx Frontier DES).  L->R collats to RPDA/RPL3.   History of echocardiogram    a. 02/2024 Echo: EF 60-65%, no rmwa, nl RV fxn, RVSP 23.60mmHg.   Hyperlipidemia LDL goal <55    Morbid obesity (HCC)    Tobacco abuse    Past Surgical History:  Procedure Laterality Date   CORONARY STENT INTERVENTION N/A 03/25/2024   Procedure: CORONARY STENT INTERVENTION;  Surgeon: Darron Deatrice LABOR, MD;  Location: [facility];  Service: Cardiovascular;  Laterality: N/A;   LEFT HEART CATH AND CORONARY ANGIOGRAPHY N/A 03/25/2024   Procedure: LEFT HEART CATH AND CORONARY ANGIOGRAPHY;  Surgeon: Darron Deatrice LABOR, MD;  Location: [facility];  Service: Cardiovascular;  Laterality: N/A;    Allergies  Allergies[1]     History of Present Illness      53 y.o. y/o female with a history of CAD, hyperlipidemia, obesity, and tobacco abuse.  She was admitted to [facility] in late October 2025 in the setting of a 2-week history of intermittent neck, chest, and arm pain.  Troponin elevated to 1928.  CTA of the chest was negative for PE but showed mild coronary calcifications and aortic atherosclerosis.  Diagnostic catheterization revealed moderate LAD and circumflex disease with a total occlusion of the proximal/mid RCA, which was successfully treated with a drug-eluting stent.  Echo during  hospitalization showed EF of 60-65% without regional wall motion abnormalities normal RV function.   Marisa Malone was last seen in cardiology clinic in November 2025, at which time she was doing well.  She had switched from cigarettes to vaping.  Since her last visit, she has done well.  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  She opted not to participate in cardiac rehab as she does not know that she will have the time during the week.  She also has not been exercising.  She denies chest pain, dyspnea, palpitations, PND, orthopnea, dizziness, syncope, edema, or early satiety.   Objective   Home Medications    Current Outpatient Medications  Medication Sig Dispense Refill   aspirin  EC 81 MG tablet Take 1 tablet (81 mg total) by mouth daily. Swallow whole. 30 tablet 5   atorvastatin  (LIPITOR ) 80 MG tablet Take 1 tablet (80 mg total) by mouth daily. 30 tablet 5   prasugrel  (EFFIENT ) 10 MG TABS tablet Take 1 tablet (10 mg total) by mouth daily. 30 tablet 5   metoprolol  tartrate (LOPRESSOR ) 25 MG tablet Take 1 tablet (25 mg total) by mouth 2 (two) times daily. 30 tablet 5   No current facility-administered medications for this visit.     Physical Exam    VS:  BP 130/80 (BP Location: Left Arm, Patient Position: Sitting, Cuff Size: Normal)   Pulse 81   Ht 5' 4 (1.626 m)  Wt 215 lb 6 oz (97.7 kg)   SpO2 96%   BMI 36.97 kg/m  , BMI Body mass index is 36.97 kg/m.          Cardiac Rehabilitation Eligibility Assessment  The patient is ready to start cardiac rehabilitation from a cardiac standpoint.   GEN: Well nourished, well developed, in no acute distress. HEENT: normal. Neck: Supple, no JVD, carotid bruits, or masses. Cardiac: RRR, no murmurs, rubs, or gallops. No clubbing, cyanosis, edema.  Radials 2+/PT 2+ and equal bilaterally.  Respiratory:  Respirations regular and unlabored, clear to auscultation bilaterally. GI: Soft, nontender, nondistended, BS +  x 4. MS: no deformity or atrophy. Skin: warm and dry, no rash. Neuro:  Strength and sensation are intact. Psych: Normal affect.  Accessory Clinical Findings     Lab Results  Component Value Date   WBC 6.0 03/26/2024   HGB 12.7 03/26/2024   HCT 39.4 03/26/2024   MCV 95.9 03/26/2024   PLT 186 03/26/2024   Lab Results  Component Value Date   CREATININE 0.66 03/26/2024   BUN 15 03/26/2024   NA 139 03/26/2024   K 3.5 03/26/2024   CL 107 03/26/2024   CO2 23 03/26/2024   Lab Results  Component Value Date   ALT 25 03/23/2024   AST 35 03/23/2024   ALKPHOS 47 03/23/2024   BILITOT 0.6 03/23/2024   Lab Results  Component Value Date   CHOL 169 03/25/2024   HDL 29 (L) 03/25/2024   LDLCALC 81 03/25/2024   TRIG 297 (H) 03/25/2024   CHOLHDL 5.8 03/25/2024        Assessment & Plan    1.  CAD: Status post non-STEMI in October 2025 with PCI drug-eluting stent placement to the totally occluded proximal/mid RCA.  She has been doing well w/o symptoms or limitations.  She previously declined cardiac rehabilitation and a strongly encouraged her to reconsider, as she is not exercising otherwise.  In the absence of cardiac rehab, I strongly encouraged her to find 30 minutes on her day to exercise.  She remains on aspirin , statin, beta-blocker, and prasugrel  therapy.  2.  Primary hypertension: Pressure today is 130/80 though home recordings are usually higher.  I reviewed her list today.  Often in the 140s.  Increasing metoprolol  to 25 mg twice daily.  3.  Hyperlipidemia: LDL of 81 in October 2025.  Follow-up lipids and LFTs today.  Continue statin therapy.  4.  Tobacco/nicotine  abuse/vaping: Now taking a hit on her vape every 2 hours.  She hopes to be able to wean off of this.  Complete cessation strongly advised.  5.  Morbid obesity: Strongly encourage regular exercise and discussed diet as well.  Encouraged Mediterranean diet with focus on whole food plant-based sources.  6.   Disposition: Follow-up lipids and LFTs.  Follow-up in clinic in 3 months or sooner if necessary.  Lonni Meager, NP 05/16/2024, 1:13 PM     [1] No Known Allergies

## 2024-05-17 ENCOUNTER — Ambulatory Visit: Payer: Self-pay | Admitting: Nurse Practitioner

## 2024-05-17 LAB — HEPATIC FUNCTION PANEL
ALT: 31 IU/L (ref 0–32)
AST: 25 IU/L (ref 0–40)
Albumin: 4.2 g/dL (ref 3.8–4.9)
Alkaline Phosphatase: 56 IU/L (ref 49–135)
Bilirubin Total: 0.6 mg/dL (ref 0.0–1.2)
Bilirubin, Direct: 0.21 mg/dL (ref 0.00–0.40)
Total Protein: 6.5 g/dL (ref 6.0–8.5)

## 2024-05-17 LAB — LIPID PANEL
Chol/HDL Ratio: 3.2 ratio (ref 0.0–4.4)
Cholesterol, Total: 123 mg/dL (ref 100–199)
HDL: 38 mg/dL — ABNORMAL LOW
LDL Chol Calc (NIH): 58 mg/dL (ref 0–99)
Triglycerides: 160 mg/dL — ABNORMAL HIGH (ref 0–149)
VLDL Cholesterol Cal: 27 mg/dL (ref 5–40)

## 2024-08-30 ENCOUNTER — Ambulatory Visit: Admitting: Nurse Practitioner
